# Patient Record
Sex: Male | Born: 1960 | Race: White | Hispanic: No | Marital: Married | Smoking: Former smoker
Health system: Southern US, Community
[De-identification: ages and names within clinical notes are randomized; demographics above are authoritative.]

## PROBLEM LIST (undated history)

## (undated) DIAGNOSIS — J449 Chronic obstructive pulmonary disease, unspecified: Secondary | ICD-10-CM

## (undated) DIAGNOSIS — I10 Essential (primary) hypertension: Secondary | ICD-10-CM

## (undated) DIAGNOSIS — F32A Depression, unspecified: Secondary | ICD-10-CM

## (undated) DIAGNOSIS — Z85038 Personal history of other malignant neoplasm of large intestine: Secondary | ICD-10-CM

## (undated) DIAGNOSIS — F419 Anxiety disorder, unspecified: Secondary | ICD-10-CM

## (undated) DIAGNOSIS — C801 Malignant (primary) neoplasm, unspecified: Secondary | ICD-10-CM

## (undated) DIAGNOSIS — F329 Major depressive disorder, single episode, unspecified: Secondary | ICD-10-CM

## (undated) DIAGNOSIS — E785 Hyperlipidemia, unspecified: Secondary | ICD-10-CM

## (undated) DIAGNOSIS — K219 Gastro-esophageal reflux disease without esophagitis: Secondary | ICD-10-CM

## (undated) HISTORY — DX: Chronic obstructive pulmonary disease, unspecified: J44.9

## (undated) HISTORY — PX: ESOPHAGOGASTRODUODENOSCOPY: SHX1529

## (undated) HISTORY — DX: Personal history of other malignant neoplasm of large intestine: Z85.038

## (undated) HISTORY — DX: Malignant (primary) neoplasm, unspecified: C80.1

## (undated) HISTORY — DX: Essential (primary) hypertension: I10

## (undated) HISTORY — PX: ENDOSCOPIC INSERTION PERITONEAL CATHETER PORT: SUR440

## (undated) HISTORY — DX: Gastro-esophageal reflux disease without esophagitis: K21.9

## (undated) HISTORY — DX: Hyperlipidemia, unspecified: E78.5

---

## 2010-08-13 HISTORY — PX: COLON SURGERY: SHX602

## 2010-08-17 ENCOUNTER — Inpatient Hospital Stay (HOSPITAL_COMMUNITY): Admission: EM | Admit: 2010-08-17 | Discharge: 2010-08-25 | Payer: Self-pay

## 2010-08-19 ENCOUNTER — Encounter (INDEPENDENT_AMBULATORY_CARE_PROVIDER_SITE_OTHER): Payer: Self-pay

## 2010-08-20 ENCOUNTER — Encounter (INDEPENDENT_AMBULATORY_CARE_PROVIDER_SITE_OTHER): Payer: Self-pay

## 2010-08-20 DIAGNOSIS — Z85038 Personal history of other malignant neoplasm of large intestine: Secondary | ICD-10-CM

## 2010-08-20 HISTORY — DX: Personal history of other malignant neoplasm of large intestine: Z85.038

## 2010-08-25 ENCOUNTER — Ambulatory Visit: Payer: Self-pay | Admitting: Oncology

## 2010-09-08 ENCOUNTER — Ambulatory Visit (HOSPITAL_COMMUNITY)
Admission: RE | Admit: 2010-09-08 | Discharge: 2010-09-08 | Payer: Self-pay | Source: Home / Self Care | Attending: Surgery | Admitting: Surgery

## 2010-09-10 LAB — COMPREHENSIVE METABOLIC PANEL
ALT: 14 U/L (ref 0–53)
AST: 15 U/L (ref 0–37)
Alkaline Phosphatase: 54 U/L (ref 39–117)
BUN: 10 mg/dL (ref 6–23)
CO2: 25 mEq/L (ref 19–32)
Calcium: 8.8 mg/dL (ref 8.4–10.5)
Chloride: 102 mEq/L (ref 96–112)
Glucose, Bld: 132 mg/dL — ABNORMAL HIGH (ref 70–99)

## 2010-09-10 LAB — CBC WITH DIFFERENTIAL/PLATELET
Eosinophils Absolute: 0.2 10*3/uL (ref 0.0–0.5)
HCT: 29.5 % — ABNORMAL LOW (ref 38.4–49.9)
HGB: 9.8 g/dL — ABNORMAL LOW (ref 13.0–17.1)
MCV: 83.1 fL (ref 79.3–98.0)
RBC: 3.55 10*6/uL — ABNORMAL LOW (ref 4.20–5.82)
RDW: 13.9 % (ref 11.0–14.6)
WBC: 4 10*3/uL (ref 4.0–10.3)

## 2010-09-17 ENCOUNTER — Ambulatory Visit (HOSPITAL_COMMUNITY)
Admission: RE | Admit: 2010-09-17 | Discharge: 2010-09-17 | Payer: Self-pay | Source: Home / Self Care | Attending: Oncology | Admitting: Oncology

## 2010-09-24 ENCOUNTER — Ambulatory Visit (HOSPITAL_BASED_OUTPATIENT_CLINIC_OR_DEPARTMENT_OTHER): Payer: Self-pay | Admitting: Oncology

## 2010-10-15 ENCOUNTER — Encounter (HOSPITAL_BASED_OUTPATIENT_CLINIC_OR_DEPARTMENT_OTHER): Payer: Self-pay | Admitting: Oncology

## 2010-10-15 ENCOUNTER — Observation Stay (HOSPITAL_COMMUNITY)
Admission: AD | Admit: 2010-10-15 | Discharge: 2010-10-16 | Disposition: A | Discharge status: Home / Self Care | Payer: Self-pay | Source: Ambulatory Visit | Attending: Oncology | Admitting: Oncology

## 2010-10-15 DIAGNOSIS — J449 Chronic obstructive pulmonary disease, unspecified: Secondary | ICD-10-CM

## 2010-10-15 DIAGNOSIS — R29898 Other symptoms and signs involving the musculoskeletal system: Secondary | ICD-10-CM | POA: Insufficient documentation

## 2010-10-15 DIAGNOSIS — C183 Malignant neoplasm of hepatic flexure: Secondary | ICD-10-CM

## 2010-10-15 DIAGNOSIS — Z5111 Encounter for antineoplastic chemotherapy: Secondary | ICD-10-CM

## 2010-10-15 DIAGNOSIS — C189 Malignant neoplasm of colon, unspecified: Secondary | ICD-10-CM

## 2010-10-15 DIAGNOSIS — Z79899 Other long term (current) drug therapy: Secondary | ICD-10-CM | POA: Insufficient documentation

## 2010-10-15 DIAGNOSIS — H538 Other visual disturbances: Secondary | ICD-10-CM | POA: Insufficient documentation

## 2010-10-15 DIAGNOSIS — J4489 Other specified chronic obstructive pulmonary disease: Secondary | ICD-10-CM | POA: Insufficient documentation

## 2010-10-15 DIAGNOSIS — R259 Unspecified abnormal involuntary movements: Secondary | ICD-10-CM | POA: Insufficient documentation

## 2010-10-15 DIAGNOSIS — T451X5A Adverse effect of antineoplastic and immunosuppressive drugs, initial encounter: Secondary | ICD-10-CM | POA: Insufficient documentation

## 2010-10-15 DIAGNOSIS — D649 Anemia, unspecified: Secondary | ICD-10-CM

## 2010-10-15 DIAGNOSIS — R109 Unspecified abdominal pain: Secondary | ICD-10-CM | POA: Insufficient documentation

## 2010-10-15 LAB — CBC WITH DIFFERENTIAL/PLATELET
BASO%: 0.7 % (ref 0.0–2.0)
Basophils Absolute: 0 10*3/uL (ref 0.0–0.1)
EOS%: 3.6 % (ref 0.0–7.0)
Eosinophils Absolute: 0.2 10*3/uL (ref 0.0–0.5)
HCT: 32 % — ABNORMAL LOW (ref 38.4–49.9)
HGB: 10.5 g/dL — ABNORMAL LOW (ref 13.0–17.1)
MONO%: 14.5 % — ABNORMAL HIGH (ref 0.0–14.0)
NEUT#: 1.9 10*3/uL (ref 1.5–6.5)
WBC: 4.2 10*3/uL (ref 4.0–10.3)
lymph#: 1.5 10*3/uL (ref 0.9–3.3)

## 2010-10-15 LAB — COMPREHENSIVE METABOLIC PANEL
Calcium: 9.4 mg/dL (ref 8.4–10.5)
Chloride: 101 mEq/L (ref 96–112)
Glucose, Bld: 105 mg/dL — ABNORMAL HIGH (ref 70–99)
Potassium: 4.2 mEq/L (ref 3.5–5.3)
Total Bilirubin: 0.6 mg/dL (ref 0.3–1.2)

## 2010-11-05 ENCOUNTER — Encounter (HOSPITAL_BASED_OUTPATIENT_CLINIC_OR_DEPARTMENT_OTHER): Payer: Self-pay | Admitting: Oncology

## 2010-11-05 ENCOUNTER — Other Ambulatory Visit: Payer: Self-pay | Admitting: Oncology

## 2010-11-05 DIAGNOSIS — Z5111 Encounter for antineoplastic chemotherapy: Secondary | ICD-10-CM

## 2010-11-05 DIAGNOSIS — C183 Malignant neoplasm of hepatic flexure: Secondary | ICD-10-CM

## 2010-11-05 DIAGNOSIS — D649 Anemia, unspecified: Secondary | ICD-10-CM

## 2010-11-05 LAB — COMPREHENSIVE METABOLIC PANEL
AST: 31 U/L (ref 0–37)
Alkaline Phosphatase: 74 U/L (ref 39–117)
BUN: 8 mg/dL (ref 6–23)
CO2: 24 mEq/L (ref 19–32)
Chloride: 101 mEq/L (ref 96–112)
Creatinine, Ser: 1.19 mg/dL (ref 0.40–1.50)
Glucose, Bld: 102 mg/dL — ABNORMAL HIGH (ref 70–99)
Potassium: 3.4 mEq/L — ABNORMAL LOW (ref 3.5–5.3)

## 2010-11-05 LAB — CBC WITH DIFFERENTIAL/PLATELET
BASO%: 0.7 % (ref 0.0–2.0)
HCT: 30.1 % — ABNORMAL LOW (ref 38.4–49.9)
MCV: 81.6 fL (ref 79.3–98.0)
NEUT#: 2.6 10*3/uL (ref 1.5–6.5)
Platelets: 254 10*3/uL (ref 140–400)
RBC: 3.69 10*6/uL — ABNORMAL LOW (ref 4.20–5.82)
lymph#: 2.1 10*3/uL (ref 0.9–3.3)

## 2010-11-23 LAB — CBC
MCH: 27.3 pg (ref 26.0–34.0)
Platelets: 293 10*3/uL (ref 150–400)
RBC: 3.92 MIL/uL — ABNORMAL LOW (ref 4.22–5.81)
RDW: 13.3 % (ref 11.5–15.5)
WBC: 4.6 10*3/uL (ref 4.0–10.5)

## 2010-11-23 LAB — BASIC METABOLIC PANEL
BUN: 12 mg/dL (ref 6–23)
Calcium: 9.2 mg/dL (ref 8.4–10.5)
Creatinine, Ser: 1.1 mg/dL (ref 0.4–1.5)

## 2010-11-24 LAB — HEMOCCULT GUIAC POC 1CARD (OFFICE): Fecal Occult Bld: POSITIVE

## 2010-11-24 LAB — CBC
HCT: 28.4 % — ABNORMAL LOW (ref 39.0–52.0)
HCT: 31.2 % — ABNORMAL LOW (ref 39.0–52.0)
HCT: 31.3 % — ABNORMAL LOW (ref 39.0–52.0)
HCT: 33.1 % — ABNORMAL LOW (ref 39.0–52.0)
HCT: 33.9 % — ABNORMAL LOW (ref 39.0–52.0)
Hemoglobin: 10.7 g/dL — ABNORMAL LOW (ref 13.0–17.0)
Hemoglobin: 10.7 g/dL — ABNORMAL LOW (ref 13.0–17.0)
MCH: 27.3 pg (ref 26.0–34.0)
MCH: 27.3 pg (ref 26.0–34.0)
MCH: 27.3 pg (ref 26.0–34.0)
MCH: 27.5 pg (ref 26.0–34.0)
MCH: 27.6 pg (ref 26.0–34.0)
MCHC: 31.3 g/dL (ref 30.0–36.0)
MCHC: 31.4 g/dL (ref 30.0–36.0)
MCHC: 31.6 g/dL (ref 30.0–36.0)
MCHC: 32.3 g/dL (ref 30.0–36.0)
MCV: 84.3 fL (ref 78.0–100.0)
Platelets: 178 10*3/uL (ref 150–400)
Platelets: 184 10*3/uL (ref 150–400)
Platelets: 227 10*3/uL (ref 150–400)
RBC: 3.37 MIL/uL — ABNORMAL LOW (ref 4.22–5.81)
RBC: 3.59 MIL/uL — ABNORMAL LOW (ref 4.22–5.81)
RBC: 3.69 MIL/uL — ABNORMAL LOW (ref 4.22–5.81)
WBC: 4.9 10*3/uL (ref 4.0–10.5)
WBC: 6 10*3/uL (ref 4.0–10.5)

## 2010-11-24 LAB — MRSA PCR SCREENING: MRSA by PCR: NEGATIVE

## 2010-11-24 LAB — BASIC METABOLIC PANEL
CO2: 26 mEq/L (ref 19–32)
CO2: 27 mEq/L (ref 19–32)
CO2: 31 mEq/L (ref 19–32)
Calcium: 7.7 mg/dL — ABNORMAL LOW (ref 8.4–10.5)
Chloride: 104 mEq/L (ref 96–112)
Chloride: 95 mEq/L — ABNORMAL LOW (ref 96–112)
Creatinine, Ser: 1.09 mg/dL (ref 0.4–1.5)
GFR calc Af Amer: >60 mL/min (ref >60)
GFR calc non Af Amer: >60 mL/min (ref >60)
GFR calc non Af Amer: >60 mL/min (ref >60)
Glucose, Bld: 100 mg/dL — ABNORMAL HIGH (ref 70–99)
Potassium: 3.7 mEq/L (ref 3.5–5.1)
Potassium: 4.4 mEq/L (ref 3.5–5.1)
Sodium: 142 mEq/L (ref 135–145)

## 2010-11-24 LAB — DIFFERENTIAL
Basophils Relative: 0 % (ref 0–1)
Lymphocytes Relative: 13 % (ref 12–46)
Lymphs Abs: 1.3 10*3/uL (ref 0.7–4.0)
Neutro Abs: 7.4 10*3/uL (ref 1.7–7.7)
Neutrophils Relative %: 77 % (ref 43–77)

## 2010-11-24 LAB — COMPREHENSIVE METABOLIC PANEL
Albumin: 2.8 g/dL — ABNORMAL LOW (ref 3.5–5.2)
BUN: 6 mg/dL (ref 6–23)
Calcium: 7.9 mg/dL — ABNORMAL LOW (ref 8.4–10.5)
Creatinine, Ser: 1.02 mg/dL (ref 0.4–1.5)
Total Protein: 5.5 g/dL — ABNORMAL LOW (ref 6.0–8.3)

## 2010-11-24 LAB — TYPE AND SCREEN: Antibody Screen: NEGATIVE

## 2010-11-26 ENCOUNTER — Other Ambulatory Visit: Payer: Self-pay | Admitting: Oncology

## 2010-11-26 ENCOUNTER — Encounter (HOSPITAL_BASED_OUTPATIENT_CLINIC_OR_DEPARTMENT_OTHER): Payer: Self-pay | Admitting: Oncology

## 2010-11-26 DIAGNOSIS — D649 Anemia, unspecified: Secondary | ICD-10-CM

## 2010-11-26 DIAGNOSIS — C183 Malignant neoplasm of hepatic flexure: Secondary | ICD-10-CM

## 2010-11-26 DIAGNOSIS — J449 Chronic obstructive pulmonary disease, unspecified: Secondary | ICD-10-CM

## 2010-11-26 DIAGNOSIS — Z5111 Encounter for antineoplastic chemotherapy: Secondary | ICD-10-CM

## 2010-11-26 LAB — COMPREHENSIVE METABOLIC PANEL
AST: 25 U/L (ref 0–37)
Alkaline Phosphatase: 70 U/L (ref 39–117)
BUN: 11 mg/dL (ref 6–23)
Creatinine, Ser: 1.1 mg/dL (ref 0.40–1.50)
Glucose, Bld: 123 mg/dL — ABNORMAL HIGH (ref 70–99)
Total Bilirubin: 0.5 mg/dL (ref 0.3–1.2)

## 2010-11-26 LAB — CBC WITH DIFFERENTIAL/PLATELET
BASO%: 0.2 % (ref 0.0–2.0)
Basophils Absolute: 0 10*3/uL (ref 0.0–0.1)
EOS%: 2.2 % (ref 0.0–7.0)
HCT: 29.7 % — ABNORMAL LOW (ref 38.4–49.9)
LYMPH%: 34.3 % (ref 14.0–49.0)
MCH: 28.4 pg (ref 27.2–33.4)
MCHC: 32.7 g/dL (ref 32.0–36.0)
MCV: 87.1 fL (ref 79.3–98.0)
MONO%: 12.4 % (ref 0.0–14.0)
NEUT%: 50.9 % (ref 39.0–75.0)
Platelets: 216 10*3/uL (ref 140–400)
lymph#: 1.6 10*3/uL (ref 0.9–3.3)

## 2010-12-17 ENCOUNTER — Encounter (HOSPITAL_BASED_OUTPATIENT_CLINIC_OR_DEPARTMENT_OTHER): Payer: Self-pay | Admitting: Oncology

## 2010-12-17 ENCOUNTER — Other Ambulatory Visit: Payer: Self-pay | Admitting: Oncology

## 2010-12-17 DIAGNOSIS — Z5111 Encounter for antineoplastic chemotherapy: Secondary | ICD-10-CM

## 2010-12-17 DIAGNOSIS — C183 Malignant neoplasm of hepatic flexure: Secondary | ICD-10-CM

## 2010-12-17 DIAGNOSIS — D649 Anemia, unspecified: Secondary | ICD-10-CM

## 2010-12-17 DIAGNOSIS — R29898 Other symptoms and signs involving the musculoskeletal system: Secondary | ICD-10-CM

## 2010-12-17 LAB — COMPREHENSIVE METABOLIC PANEL
Albumin: 4 g/dL (ref 3.5–5.2)
Alkaline Phosphatase: 55 U/L (ref 39–117)
BUN: 7 mg/dL (ref 6–23)
CO2: 23 mEq/L (ref 19–32)
Glucose, Bld: 102 mg/dL — ABNORMAL HIGH (ref 70–99)
Potassium: 4.3 mEq/L (ref 3.5–5.3)
Sodium: 137 mEq/L (ref 135–145)
Total Protein: 6.7 g/dL (ref 6.0–8.3)

## 2010-12-17 LAB — CBC WITH DIFFERENTIAL/PLATELET
Basophils Absolute: 0 10*3/uL (ref 0.0–0.1)
EOS%: 1.8 % (ref 0.0–7.0)
Eosinophils Absolute: 0.1 10*3/uL (ref 0.0–0.5)
HCT: 32.9 % — ABNORMAL LOW (ref 38.4–49.9)
HGB: 10.8 g/dL — ABNORMAL LOW (ref 13.0–17.1)
MCH: 31.3 pg (ref 27.2–33.4)
MCV: 95.4 fL (ref 79.3–98.0)
MONO%: 12 % (ref 0.0–14.0)
NEUT#: 2.2 10*3/uL (ref 1.5–6.5)
NEUT%: 50.7 % (ref 39.0–75.0)
RDW: 26.5 % — ABNORMAL HIGH (ref 11.0–14.6)
lymph#: 1.5 10*3/uL (ref 0.9–3.3)

## 2010-12-18 ENCOUNTER — Encounter: Payer: Self-pay | Admitting: Genetic Counselor

## 2011-01-07 ENCOUNTER — Other Ambulatory Visit: Payer: Self-pay | Admitting: Oncology

## 2011-01-07 ENCOUNTER — Encounter (HOSPITAL_BASED_OUTPATIENT_CLINIC_OR_DEPARTMENT_OTHER): Payer: Self-pay | Admitting: Oncology

## 2011-01-07 DIAGNOSIS — C183 Malignant neoplasm of hepatic flexure: Secondary | ICD-10-CM

## 2011-01-07 DIAGNOSIS — J449 Chronic obstructive pulmonary disease, unspecified: Secondary | ICD-10-CM

## 2011-01-07 DIAGNOSIS — D649 Anemia, unspecified: Secondary | ICD-10-CM

## 2011-01-07 DIAGNOSIS — J4489 Other specified chronic obstructive pulmonary disease: Secondary | ICD-10-CM

## 2011-01-07 DIAGNOSIS — Z5111 Encounter for antineoplastic chemotherapy: Secondary | ICD-10-CM

## 2011-01-07 LAB — CBC WITH DIFFERENTIAL/PLATELET
Basophils Absolute: 0 10*3/uL (ref 0.0–0.1)
Eosinophils Absolute: 0.2 10*3/uL (ref 0.0–0.5)
HGB: 11.4 g/dL — ABNORMAL LOW (ref 13.0–17.1)
MCV: 97.7 fL (ref 79.3–98.0)
MONO#: 0.6 10*3/uL (ref 0.1–0.9)
NEUT#: 2.3 10*3/uL (ref 1.5–6.5)
RBC: 3.45 10*6/uL — ABNORMAL LOW (ref 4.20–5.82)
RDW: 21.5 % — ABNORMAL HIGH (ref 11.0–14.6)
WBC: 4.2 10*3/uL (ref 4.0–10.3)
lymph#: 1.1 10*3/uL (ref 0.9–3.3)

## 2011-01-07 LAB — COMPREHENSIVE METABOLIC PANEL
Albumin: 4 g/dL (ref 3.5–5.2)
Alkaline Phosphatase: 60 U/L (ref 39–117)
BUN: 10 mg/dL (ref 6–23)
CO2: 22 mEq/L (ref 19–32)
Calcium: 9.1 mg/dL (ref 8.4–10.5)
Chloride: 104 mEq/L (ref 96–112)
Glucose, Bld: 98 mg/dL (ref 70–99)
Potassium: 4.2 mEq/L (ref 3.5–5.3)
Sodium: 137 mEq/L (ref 135–145)
Total Protein: 6.8 g/dL (ref 6.0–8.3)

## 2011-01-21 ENCOUNTER — Ambulatory Visit (INDEPENDENT_AMBULATORY_CARE_PROVIDER_SITE_OTHER): Payer: Self-pay | Admitting: Psychiatry

## 2011-01-21 DIAGNOSIS — F1010 Alcohol abuse, uncomplicated: Secondary | ICD-10-CM

## 2011-01-21 DIAGNOSIS — F0630 Mood disorder due to known physiological condition, unspecified: Secondary | ICD-10-CM

## 2011-01-27 ENCOUNTER — Ambulatory Visit (INDEPENDENT_AMBULATORY_CARE_PROVIDER_SITE_OTHER): Payer: Self-pay | Admitting: Psychiatry

## 2011-01-27 ENCOUNTER — Ambulatory Visit: Payer: Self-pay | Admitting: Psychiatry

## 2011-01-27 DIAGNOSIS — F0630 Mood disorder due to known physiological condition, unspecified: Secondary | ICD-10-CM

## 2011-01-27 DIAGNOSIS — F1010 Alcohol abuse, uncomplicated: Secondary | ICD-10-CM

## 2011-01-28 ENCOUNTER — Encounter (HOSPITAL_BASED_OUTPATIENT_CLINIC_OR_DEPARTMENT_OTHER): Payer: Self-pay | Admitting: Oncology

## 2011-01-28 ENCOUNTER — Other Ambulatory Visit: Payer: Self-pay | Admitting: Oncology

## 2011-01-28 DIAGNOSIS — Z5111 Encounter for antineoplastic chemotherapy: Secondary | ICD-10-CM

## 2011-01-28 DIAGNOSIS — C183 Malignant neoplasm of hepatic flexure: Secondary | ICD-10-CM

## 2011-01-28 DIAGNOSIS — D649 Anemia, unspecified: Secondary | ICD-10-CM

## 2011-01-28 LAB — CBC WITH DIFFERENTIAL/PLATELET
Eosinophils Absolute: 0.1 10*3/uL (ref 0.0–0.5)
HGB: 11.3 g/dL — ABNORMAL LOW (ref 13.0–17.1)
MCV: 99.7 fL — ABNORMAL HIGH (ref 79.3–98.0)
MONO%: 14 % (ref 0.0–14.0)
NEUT#: 2 10*3/uL (ref 1.5–6.5)
RBC: 3.34 10*6/uL — ABNORMAL LOW (ref 4.20–5.82)
RDW: 19.8 % — ABNORMAL HIGH (ref 11.0–14.6)
WBC: 4.1 10*3/uL (ref 4.0–10.3)
lymph#: 1.5 10*3/uL (ref 0.9–3.3)
nRBC: 0 % (ref 0–0)

## 2011-01-28 LAB — COMPREHENSIVE METABOLIC PANEL
ALT: 29 U/L (ref 0–53)
Albumin: 3.9 g/dL (ref 3.5–5.2)
Alkaline Phosphatase: 69 U/L (ref 39–117)
CO2: 23 mEq/L (ref 19–32)
Potassium: 4.3 mEq/L (ref 3.5–5.3)
Sodium: 136 mEq/L (ref 135–145)
Total Bilirubin: 0.3 mg/dL (ref 0.3–1.2)
Total Protein: 7 g/dL (ref 6.0–8.3)

## 2011-02-10 ENCOUNTER — Ambulatory Visit (INDEPENDENT_AMBULATORY_CARE_PROVIDER_SITE_OTHER): Payer: Self-pay | Admitting: Psychiatry

## 2011-02-10 DIAGNOSIS — F1010 Alcohol abuse, uncomplicated: Secondary | ICD-10-CM

## 2011-02-10 DIAGNOSIS — F0630 Mood disorder due to known physiological condition, unspecified: Secondary | ICD-10-CM

## 2011-02-17 ENCOUNTER — Ambulatory Visit: Payer: Self-pay | Admitting: Psychiatry

## 2011-02-18 ENCOUNTER — Encounter (HOSPITAL_BASED_OUTPATIENT_CLINIC_OR_DEPARTMENT_OTHER): Payer: Self-pay | Admitting: Oncology

## 2011-02-18 ENCOUNTER — Other Ambulatory Visit: Payer: Self-pay | Admitting: Oncology

## 2011-02-18 DIAGNOSIS — Z5111 Encounter for antineoplastic chemotherapy: Secondary | ICD-10-CM

## 2011-02-18 DIAGNOSIS — C183 Malignant neoplasm of hepatic flexure: Secondary | ICD-10-CM

## 2011-02-18 DIAGNOSIS — R29898 Other symptoms and signs involving the musculoskeletal system: Secondary | ICD-10-CM

## 2011-02-18 DIAGNOSIS — D649 Anemia, unspecified: Secondary | ICD-10-CM

## 2011-02-18 LAB — CBC WITH DIFFERENTIAL/PLATELET
BASO%: 0.5 % (ref 0.0–2.0)
EOS%: 1.7 % (ref 0.0–7.0)
LYMPH%: 32.5 % (ref 14.0–49.0)
MCHC: 33.9 g/dL (ref 32.0–36.0)
MCV: 99.7 fL — ABNORMAL HIGH (ref 79.3–98.0)
MONO%: 12.8 % (ref 0.0–14.0)
Platelets: 176 10*3/uL (ref 140–400)
RBC: 3.37 10*6/uL — ABNORMAL LOW (ref 4.20–5.82)
nRBC: 0 % (ref 0–0)

## 2011-02-18 LAB — COMPREHENSIVE METABOLIC PANEL
ALT: 31 U/L (ref 0–53)
AST: 36 U/L (ref 0–37)
Alkaline Phosphatase: 70 U/L (ref 39–117)
Creatinine, Ser: 0.96 mg/dL (ref 0.50–1.35)
Total Bilirubin: 0.3 mg/dL (ref 0.3–1.2)

## 2011-02-24 ENCOUNTER — Ambulatory Visit (INDEPENDENT_AMBULATORY_CARE_PROVIDER_SITE_OTHER): Payer: Self-pay | Admitting: Psychiatry

## 2011-02-24 DIAGNOSIS — F1010 Alcohol abuse, uncomplicated: Secondary | ICD-10-CM

## 2011-02-24 DIAGNOSIS — F0630 Mood disorder due to known physiological condition, unspecified: Secondary | ICD-10-CM

## 2011-03-18 ENCOUNTER — Other Ambulatory Visit: Payer: Self-pay | Admitting: Oncology

## 2011-03-18 ENCOUNTER — Encounter (HOSPITAL_BASED_OUTPATIENT_CLINIC_OR_DEPARTMENT_OTHER): Payer: Self-pay | Admitting: Oncology

## 2011-03-18 ENCOUNTER — Ambulatory Visit (INDEPENDENT_AMBULATORY_CARE_PROVIDER_SITE_OTHER): Payer: Self-pay | Admitting: Psychiatry

## 2011-03-18 DIAGNOSIS — R29898 Other symptoms and signs involving the musculoskeletal system: Secondary | ICD-10-CM

## 2011-03-18 DIAGNOSIS — D649 Anemia, unspecified: Secondary | ICD-10-CM

## 2011-03-18 DIAGNOSIS — Z5111 Encounter for antineoplastic chemotherapy: Secondary | ICD-10-CM

## 2011-03-18 DIAGNOSIS — F0630 Mood disorder due to known physiological condition, unspecified: Secondary | ICD-10-CM

## 2011-03-18 DIAGNOSIS — C183 Malignant neoplasm of hepatic flexure: Secondary | ICD-10-CM

## 2011-03-18 DIAGNOSIS — F1010 Alcohol abuse, uncomplicated: Secondary | ICD-10-CM

## 2011-03-18 LAB — CBC WITH DIFFERENTIAL/PLATELET
BASO%: 0.3 % (ref 0.0–2.0)
HCT: 34.1 % — ABNORMAL LOW (ref 38.4–49.9)
LYMPH%: 33.7 % (ref 14.0–49.0)
MCH: 34 pg — ABNORMAL HIGH (ref 27.2–33.4)
MCHC: 34 g/dL (ref 32.0–36.0)
MONO#: 0.6 10*3/uL (ref 0.1–0.9)
NEUT%: 44.9 % (ref 39.0–75.0)
Platelets: 160 10*3/uL (ref 140–400)
WBC: 3.3 10*3/uL — ABNORMAL LOW (ref 4.0–10.3)

## 2011-04-01 ENCOUNTER — Encounter (HOSPITAL_BASED_OUTPATIENT_CLINIC_OR_DEPARTMENT_OTHER): Payer: Self-pay | Admitting: Oncology

## 2011-04-01 ENCOUNTER — Other Ambulatory Visit: Payer: Self-pay | Admitting: Oncology

## 2011-04-01 DIAGNOSIS — Z452 Encounter for adjustment and management of vascular access device: Secondary | ICD-10-CM

## 2011-04-01 DIAGNOSIS — C183 Malignant neoplasm of hepatic flexure: Secondary | ICD-10-CM

## 2011-04-21 ENCOUNTER — Ambulatory Visit: Payer: Self-pay | Admitting: Psychiatry

## 2011-05-13 ENCOUNTER — Other Ambulatory Visit: Payer: Self-pay | Admitting: Oncology

## 2011-05-13 ENCOUNTER — Encounter (HOSPITAL_BASED_OUTPATIENT_CLINIC_OR_DEPARTMENT_OTHER): Payer: Self-pay | Admitting: Oncology

## 2011-05-13 DIAGNOSIS — C189 Malignant neoplasm of colon, unspecified: Secondary | ICD-10-CM

## 2011-05-13 DIAGNOSIS — C183 Malignant neoplasm of hepatic flexure: Secondary | ICD-10-CM

## 2011-05-14 ENCOUNTER — Other Ambulatory Visit: Payer: Self-pay | Admitting: Oncology

## 2011-05-14 DIAGNOSIS — C189 Malignant neoplasm of colon, unspecified: Secondary | ICD-10-CM

## 2011-06-25 ENCOUNTER — Encounter (INDEPENDENT_AMBULATORY_CARE_PROVIDER_SITE_OTHER): Payer: Self-pay | Admitting: Surgery

## 2011-06-25 ENCOUNTER — Ambulatory Visit (INDEPENDENT_AMBULATORY_CARE_PROVIDER_SITE_OTHER): Payer: Self-pay | Admitting: Surgery

## 2011-06-25 VITALS — BP 127/82 | HR 64 | Temp 97.3°F | Resp 14 | Ht 67.0 in | Wt 218.4 lb

## 2011-06-25 DIAGNOSIS — Z452 Encounter for adjustment and management of vascular access device: Secondary | ICD-10-CM

## 2011-06-25 NOTE — Progress Notes (Signed)
Mr. Sauseda comes in today to discuss his Port-A-Cath removal. 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 Otherwise his need for his port is gone and he wishes it to be removed.  We'll schedule Port-A-Cath removal under MAC.

## 2011-07-05 ENCOUNTER — Ambulatory Visit (HOSPITAL_BASED_OUTPATIENT_CLINIC_OR_DEPARTMENT_OTHER)
Admission: RE | Admit: 2011-07-05 | Discharge: 2011-07-05 | Disposition: A | Discharge status: Home / Self Care | Payer: Self-pay | Source: Ambulatory Visit | Attending: Surgery | Admitting: Surgery

## 2011-07-05 DIAGNOSIS — I10 Essential (primary) hypertension: Secondary | ICD-10-CM | POA: Insufficient documentation

## 2011-07-05 DIAGNOSIS — G609 Hereditary and idiopathic neuropathy, unspecified: Secondary | ICD-10-CM | POA: Insufficient documentation

## 2011-07-05 DIAGNOSIS — J4489 Other specified chronic obstructive pulmonary disease: Secondary | ICD-10-CM | POA: Insufficient documentation

## 2011-07-05 DIAGNOSIS — J449 Chronic obstructive pulmonary disease, unspecified: Secondary | ICD-10-CM | POA: Insufficient documentation

## 2011-07-05 DIAGNOSIS — Z452 Encounter for adjustment and management of vascular access device: Secondary | ICD-10-CM | POA: Insufficient documentation

## 2011-07-05 DIAGNOSIS — Z85038 Personal history of other malignant neoplasm of large intestine: Secondary | ICD-10-CM | POA: Insufficient documentation

## 2011-07-05 LAB — POCT I-STAT, CHEM 8
Creatinine, Ser: 1.1 mg/dL (ref 0.50–1.35)
Glucose, Bld: 132 mg/dL — ABNORMAL HIGH (ref 70–99)
Hemoglobin: 14.3 g/dL (ref 13.0–17.0)
Potassium: 4.4 mEq/L (ref 3.5–5.1)

## 2011-07-07 NOTE — Op Note (Signed)
  [name], [name]            ACCOUNT NO.:  [id number]  MEDICAL RECORD NO.:  [id number]  LOCATION:                                 FACILITY:  PHYSICIAN:  Thornton Park. Daphine Deutscher, MD       DATE OF BIRTH:  DATE OF PROCEDURE:  07/05/2011 DATE OF DISCHARGE:                              OPERATIVE REPORT   PREOPERATIVE DIAGNOSIS:  Port-A-Cath for chemotherapy.  POSTOPERATIVE DIAGNOSIS:  Port-A-Cath for chemotherapy.  PROCEDURE:  Explantation of left subclavian Port-A-Cath.  SURGEON:  Thornton Park. Daphine Deutscher, MD  ASSISTANT:  None.  ANESTHESIA:  MAC.  DESCRIPTION OF PROCEDURE:  [name] was taken to room 80 at [facility] on Monday, July 05, 2011, given MAC anesthesia.  His left chest was prepped with PCMX and draped sterilely.  I infiltrated the area with lidocaine, excised his old scar, went down deep and grabbing the Port-A-Cath cutting the suture that was holding in place and then explanting it in toto.  The wound was then closed with 4-0 Vicryl in the depths and with interrupted 5-0 Vicryl with Dermabond. The patient tolerated the procedure well.  He will be taken to recovery room in satisfactory condition.  He will be given some Vicodin if needed for pain and be followed up in the office.     Thornton Park Daphine Deutscher, MD     MBM/MEDQ  D:  07/05/2011  T:  07/05/2011  Job:  [id number]  Electronically Signed by Luretha Murphy MD on 07/07/2011 05:01:07 PM

## 2011-07-29 ENCOUNTER — Other Ambulatory Visit: Payer: Self-pay | Admitting: *Deleted

## 2011-07-29 ENCOUNTER — Encounter: Payer: Self-pay | Admitting: *Deleted

## 2011-07-29 MED ORDER — OXYCODONE-ACETAMINOPHEN 5-325 MG PO TABS: 1.0000 | ORAL_TABLET | Freq: Four times a day (QID) | ORAL | Status: AC | PRN

## 2011-08-06 ENCOUNTER — Other Ambulatory Visit: Payer: Self-pay | Admitting: *Deleted

## 2011-08-06 DIAGNOSIS — C183 Malignant neoplasm of hepatic flexure: Secondary | ICD-10-CM

## 2011-08-06 MED ORDER — GABAPENTIN 300 MG PO CAPS: 300.0000 mg | ORAL_CAPSULE | Freq: Three times a day (TID) | ORAL | Status: DC

## 2011-08-17 ENCOUNTER — Other Ambulatory Visit: Payer: Self-pay | Admitting: Gastroenterology

## 2011-08-17 ENCOUNTER — Ambulatory Visit (HOSPITAL_COMMUNITY)
Admission: RE | Admit: 2011-08-17 | Discharge: 2011-08-17 | Disposition: A | Discharge status: Home / Self Care | Payer: Self-pay | Source: Ambulatory Visit | Attending: Gastroenterology | Admitting: Gastroenterology

## 2011-08-17 ENCOUNTER — Encounter (HOSPITAL_COMMUNITY): Payer: Self-pay | Admitting: *Deleted

## 2011-08-17 ENCOUNTER — Encounter (HOSPITAL_COMMUNITY)
Admission: RE | Disposition: A | Discharge status: Home / Self Care | Payer: Self-pay | Source: Ambulatory Visit | Attending: Gastroenterology

## 2011-08-17 DIAGNOSIS — D126 Benign neoplasm of colon, unspecified: Secondary | ICD-10-CM | POA: Insufficient documentation

## 2011-08-17 DIAGNOSIS — Z09 Encounter for follow-up examination after completed treatment for conditions other than malignant neoplasm: Secondary | ICD-10-CM | POA: Insufficient documentation

## 2011-08-17 DIAGNOSIS — K648 Other hemorrhoids: Secondary | ICD-10-CM | POA: Insufficient documentation

## 2011-08-17 DIAGNOSIS — K644 Residual hemorrhoidal skin tags: Secondary | ICD-10-CM | POA: Insufficient documentation

## 2011-08-17 DIAGNOSIS — Z85038 Personal history of other malignant neoplasm of large intestine: Secondary | ICD-10-CM | POA: Insufficient documentation

## 2011-08-17 DIAGNOSIS — K5730 Diverticulosis of large intestine without perforation or abscess without bleeding: Secondary | ICD-10-CM | POA: Insufficient documentation

## 2011-08-17 HISTORY — DX: Major depressive disorder, single episode, unspecified: F32.9

## 2011-08-17 HISTORY — DX: Anxiety disorder, unspecified: F41.9

## 2011-08-17 HISTORY — DX: Depression, unspecified: F32.A

## 2011-08-17 HISTORY — PX: COLONOSCOPY: SHX5424

## 2011-08-17 SURGERY — COLONOSCOPY
Anesthesia: Moderate Sedation

## 2011-08-17 MED ORDER — FENTANYL CITRATE 0.05 MG/ML IJ SOLN
INTRAMUSCULAR | Status: DC | PRN
  Administered 2011-08-17 (×5): 25 ug via INTRAVENOUS

## 2011-08-17 MED ORDER — MIDAZOLAM HCL 10 MG/2ML IJ SOLN
INTRAMUSCULAR | Status: DC | PRN
  Administered 2011-08-17 (×6): 2 mg via INTRAVENOUS

## 2011-08-17 MED ORDER — SODIUM CHLORIDE 0.9 % IV SOLN
Freq: Once | INTRAVENOUS | Status: AC
  Administered 2011-08-17: 500 mL via INTRAVENOUS

## 2011-08-17 MED ORDER — MIDAZOLAM HCL 10 MG/2ML IJ SOLN
INTRAMUSCULAR | Status: AC
  Filled 2011-08-17: qty 4

## 2011-08-17 MED ORDER — DIPHENHYDRAMINE HCL 50 MG/ML IJ SOLN
INTRAMUSCULAR | Status: AC
  Filled 2011-08-17: qty 1

## 2011-08-17 MED ORDER — FENTANYL CITRATE 0.05 MG/ML IJ SOLN
INTRAMUSCULAR | Status: AC
  Filled 2011-08-17: qty 4

## 2011-08-17 MED ORDER — DIPHENHYDRAMINE HCL 50 MG/ML IJ SOLN
INTRAMUSCULAR | Status: DC | PRN
  Administered 2011-08-17: 25 mg via INTRAVENOUS

## 2011-08-17 NOTE — H&P (Signed)
  Patient seen and examined in preop please see scanned report for details no new complaints or problems

## 2011-08-18 ENCOUNTER — Encounter (HOSPITAL_COMMUNITY): Payer: Self-pay

## 2011-08-18 ENCOUNTER — Encounter (HOSPITAL_COMMUNITY): Payer: Self-pay | Admitting: Gastroenterology

## 2011-08-20 ENCOUNTER — Encounter (INDEPENDENT_AMBULATORY_CARE_PROVIDER_SITE_OTHER): Payer: Self-pay | Admitting: Surgery

## 2011-08-25 ENCOUNTER — Other Ambulatory Visit: Payer: Self-pay | Admitting: Oncology

## 2011-08-25 ENCOUNTER — Other Ambulatory Visit (HOSPITAL_COMMUNITY): Payer: Self-pay

## 2011-08-25 ENCOUNTER — Ambulatory Visit (HOSPITAL_COMMUNITY)
Admission: RE | Admit: 2011-08-25 | Discharge: 2011-08-25 | Disposition: A | Discharge status: Home / Self Care | Payer: Self-pay | Source: Ambulatory Visit | Attending: Oncology | Admitting: Oncology

## 2011-08-25 ENCOUNTER — Other Ambulatory Visit (HOSPITAL_BASED_OUTPATIENT_CLINIC_OR_DEPARTMENT_OTHER): Payer: Self-pay | Admitting: Lab

## 2011-08-25 ENCOUNTER — Other Ambulatory Visit: Payer: Self-pay | Admitting: *Deleted

## 2011-08-25 DIAGNOSIS — Z980 Intestinal bypass and anastomosis status: Secondary | ICD-10-CM | POA: Insufficient documentation

## 2011-08-25 DIAGNOSIS — C183 Malignant neoplasm of hepatic flexure: Secondary | ICD-10-CM

## 2011-08-25 DIAGNOSIS — C189 Malignant neoplasm of colon, unspecified: Secondary | ICD-10-CM | POA: Insufficient documentation

## 2011-08-25 DIAGNOSIS — Z5111 Encounter for antineoplastic chemotherapy: Secondary | ICD-10-CM

## 2011-08-25 DIAGNOSIS — Z452 Encounter for adjustment and management of vascular access device: Secondary | ICD-10-CM

## 2011-08-25 DIAGNOSIS — Z9221 Personal history of antineoplastic chemotherapy: Secondary | ICD-10-CM | POA: Insufficient documentation

## 2011-08-25 DIAGNOSIS — Z9049 Acquired absence of other specified parts of digestive tract: Secondary | ICD-10-CM | POA: Insufficient documentation

## 2011-08-25 LAB — CBC WITH DIFFERENTIAL/PLATELET
BASO%: 0.4 % (ref 0.0–2.0)
HCT: 38.7 % (ref 38.4–49.9)
MCHC: 33.7 g/dL (ref 32.0–36.0)
MONO#: 0.5 10*3/uL (ref 0.1–0.9)
NEUT#: 3.1 10*3/uL (ref 1.5–6.5)
RBC: 4.46 10*6/uL (ref 4.20–5.82)
WBC: 5.3 10*3/uL (ref 4.0–10.3)
lymph#: 1.5 10*3/uL (ref 0.9–3.3)

## 2011-08-25 LAB — CMP (CANCER CENTER ONLY)
ALT(SGPT): 70 U/L — ABNORMAL HIGH (ref 10–47)
Albumin: 3.6 g/dL (ref 3.3–5.5)
CO2: 28 mEq/L (ref 18–33)
Calcium: 9.1 mg/dL (ref 8.0–10.3)
Chloride: 100 mEq/L (ref 98–108)
Sodium: 140 mEq/L (ref 128–145)
Total Protein: 7.5 g/dL (ref 6.4–8.1)

## 2011-08-25 LAB — CEA: CEA: 2.1 ng/mL (ref 0.0–5.0)

## 2011-08-25 MED ORDER — IOHEXOL 300 MG/ML  SOLN
100.0000 mL | Freq: Once | INTRAMUSCULAR | Status: AC | PRN
  Administered 2011-08-25: 100 mL via INTRAVENOUS

## 2011-08-25 MED ORDER — LORAZEPAM 1 MG PO TABS: 1.0000 mg | ORAL_TABLET | Freq: Three times a day (TID) | ORAL | Status: DC

## 2011-08-30 ENCOUNTER — Encounter: Payer: Self-pay | Admitting: *Deleted

## 2011-08-30 ENCOUNTER — Ambulatory Visit (HOSPITAL_BASED_OUTPATIENT_CLINIC_OR_DEPARTMENT_OTHER): Payer: Self-pay | Admitting: Oncology

## 2011-08-30 ENCOUNTER — Ambulatory Visit: Payer: Self-pay

## 2011-08-30 ENCOUNTER — Other Ambulatory Visit: Payer: Self-pay | Admitting: *Deleted

## 2011-08-30 VITALS — BP 143/76 | HR 84 | Temp 98.4°F | Ht 67.0 in | Wt 218.2 lb

## 2011-08-30 DIAGNOSIS — C189 Malignant neoplasm of colon, unspecified: Secondary | ICD-10-CM

## 2011-08-30 LAB — BASIC METABOLIC PANEL
Calcium: 9.6 mg/dL (ref 8.4–10.5)
Creatinine, Ser: 1.14 mg/dL (ref 0.50–1.35)

## 2011-08-30 MED ORDER — OXYCODONE-ACETAMINOPHEN 5-325 MG PO TABS: 1.0000 | ORAL_TABLET | Freq: Two times a day (BID) | ORAL | Status: AC | PRN

## 2011-08-30 NOTE — Progress Notes (Signed)
OFFICE PROGRESS NOTE   INTERVAL HISTORY:   He returns as scheduled. He feels well. He reports some improvement in the neuropathic pain with Neurontin. He continues to have numbness in the feet more so than the fingers. He does not have difficulty tying his shoes or buttoning his shirt. He has some difficulty snapping his pants.  He has developed a "rash" at the right arm, neck, and left groin. This predated the CT scan.   He would like to try returning to work in early January.  The pain surrounding the right abdominal incision persists. He currently takes 2 oxycodone tablets per day for relief of pain the  Objective:  Vital signs in last 24 hours:  Blood pressure 143/76, pulse 84, temperature 98.4 F (36.9 C), height 5\' 7"  (1.702 m), weight 218 lb 3.2 oz (98.975 kg).    HEENT: Neck without mass Lymphatics: No cervical, supraclavicular, axillary, or inguinal lymph node Resp: Lungs clear bilateral Cardio: Regular rate and GI: No hepatomegaly. No mass. There is tenderness with superficial palpation surrounding the right mid abdominal scar. Vascular: No leg edema Neuro: There is a markedly decrease in vibratory sense at the fingers and toes. Decreased sensation to light touch at the toes per  Skin: Multiple tattoos. Mild erythema at the skin folds of the neck, right elbow fold, and left groin. No vesicles.   X-rays: Restaging CTs of the chest, abdomen, and pelvis on 08/25/2011 reveal no evidence of metastatic disease  Lab Results:  Lab Results  Component Value Date   WBC 5.3 08/25/2011   HGB 13.0 08/25/2011   HCT 38.7 08/25/2011   MCV 86.7 08/25/2011   PLT 198 08/25/2011      Medications: I have reviewed the patient's current medications.  Assessment/Plan: 1. Stage III (T3 N1) colon cancer status post right hemicolectomy 08/20/2010.  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  He began treatment with CAPOX on  09/24/2010.  He completed cycle 8 beginning  02/18/2011.        -Restaging CTs of the chest, abdomen, and pelvis on 08/25/2011 reveal no evidence of metastatic disease 2. Staging CT of the chest 09/17/2010 negative for evidence of metastatic disease. 3. Anemia likely related to preoperative gastrointestinal bleeding:  Stable on labs 03/18/2011.   4. History of alcohol use. 5. History of gout with an acute flare at the foot 11/26/2010. 6. Chronic obstructive pulmonary disease. 7. Hypertension. 8. Remote history of a bleeding gastric ulcer. 9. Family history of cancer.  Microsatellite instability testing returned with a high microsatellite genotype.  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 10. Right-sided abdominal pain surrounding the transverse incision, persistent.  Question neuropathic pain related to surgery and oxaliplatin. 11. Acute neurotoxicity related to oxaliplatin manifested with leg weakness, ataxia, blurred vision following cycle 2 on 10/25/2010.  The oxaliplatin was dose reduced by 50% with cycle 3 and diluted in a larger volume with administration over 4 hours.  The acute neurotoxicity did not recur. 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  He has pain associated with the neuropathy.  He has noted improvement in the pain since beginning Neurontin. The numbness at the hands and feet persist. 13.   Surveillance colonoscopy 08/17/2011 by Dr. Ewing Schlein with 2 small benign polyps at the left colon. He should continue yearly colonoscopy with a history of hereditary non-polyposis colon cancer syndrome.  Disposition:  He remains in remission from colon cancer. He will  return for an office visit in 3 months to followup on the neuropathy symptoms. He plans to return to work in early January. I cautioned him against working while on narcotic analgesics. He should avoid activities that require fine motor skills/dexterity due to the persistent neuropathy symptoms at the hands and  feet.  The creatinine and potassium levels were mildly elevated prior to the CT scan. We obtained a repeat chemistry panel today.   Lucile Shutters, MD  08/30/2011  2:15 PM

## 2011-08-31 ENCOUNTER — Telehealth: Payer: Self-pay | Admitting: Oncology

## 2011-08-31 ENCOUNTER — Encounter: Payer: Self-pay | Admitting: *Deleted

## 2011-08-31 NOTE — Progress Notes (Signed)
Patient requested MD write script for him to take to work for employer that he can return to work as of 09/14/11. MD wrote script for following....  May return to work 09/14/2011  Can not work while taking narcotics for pain  Limit activity based on neuropathy symptoms for safety.  **mailed script to his home as requested.

## 2011-08-31 NOTE — Telephone Encounter (Signed)
Faxed disability paper to [employer] [phone]

## 2011-09-01 ENCOUNTER — Telehealth: Payer: Self-pay | Admitting: *Deleted

## 2011-09-01 NOTE — Telephone Encounter (Signed)
Patient notified that K+ and creatinine are better. Forwarded copy to Dr. Pricilla Holm.

## 2011-09-01 NOTE — Telephone Encounter (Signed)
Message copied by Wandalee Ferdinand on Wed Sep 01, 2011 11:52 AM ------      Message from: Thornton Papas B      Created: Mon Aug 30, 2011  8:11 PM       Please call patient, K+ and creatinine are better.  Copy lab to primary MD

## 2011-09-02 ENCOUNTER — Other Ambulatory Visit: Payer: Self-pay | Admitting: *Deleted

## 2011-09-02 ENCOUNTER — Telehealth: Payer: Self-pay | Admitting: Oncology

## 2011-09-02 DIAGNOSIS — C183 Malignant neoplasm of hepatic flexure: Secondary | ICD-10-CM

## 2011-09-02 NOTE — Telephone Encounter (Signed)
lmonvm advising the pt of the his lab and md appt in march

## 2011-10-13 ENCOUNTER — Telehealth (INDEPENDENT_AMBULATORY_CARE_PROVIDER_SITE_OTHER): Payer: Self-pay | Admitting: General Surgery

## 2011-10-13 NOTE — Telephone Encounter (Signed)
GLORIA Zavalza CALLED TO REPORT THAT MR Doubrava IS EXPERIENCING INTERMITTENT PAIN UNDER INCISION RLQ/ THIS STARTED LAST WEEK AFTER HE HAD BACK INJURY/ APPETITIE GOOD/ NO NAUSEA OR VOMITING BUT SOME ABD BLOATING/ BOWELS ARE MOVING/ NO FEVER/ TRACEY MADE APPT WITH DR. MARTIN FOR THIS WEEK/GY

## 2011-10-15 ENCOUNTER — Ambulatory Visit (INDEPENDENT_AMBULATORY_CARE_PROVIDER_SITE_OTHER): Payer: Self-pay | Admitting: Surgery

## 2011-10-15 ENCOUNTER — Encounter (INDEPENDENT_AMBULATORY_CARE_PROVIDER_SITE_OTHER): Payer: Self-pay | Admitting: Surgery

## 2011-10-15 DIAGNOSIS — R109 Unspecified abdominal pain: Secondary | ICD-10-CM | POA: Insufficient documentation

## 2011-10-15 NOTE — Patient Instructions (Signed)
Take 2 glasses of MiraLax when you get. 30 minutes later give yourself an enema. Return he called to [place] if these maneuvers did not alleviate her pain

## 2011-10-15 NOTE — Progress Notes (Signed)
Joe [name] and his wife came in today with a four-day history of some pain in the right side. He describes as deep and has been associated with bloating and constipation. He spoke with his primary care, Dr. Celesta Aver in [place] who directed her to our office.  He has noted constipation despite taking Colace. On examination his abdomen is relatively soft and had no rebound or guarding or masses. I feel no evidence of a hernia.  My advice is to stop by the pharmacy and pickup and enema and some MiraLax. A one to go home and drink 2 glasses of MiraLax and 30 minutes later taken enema. See if he doesn't relieve his pain and call our office this afternoon let us know if this helped. If not then we may going to obtain some laboratory and acute abdominal x-ray.    If things resolved and he can return p.r.n. Otherwise he may need to go to the emergency room it was too long for further evaluation.

## 2011-10-18 ENCOUNTER — Telehealth (INDEPENDENT_AMBULATORY_CARE_PROVIDER_SITE_OTHER): Payer: Self-pay | Admitting: General Surgery

## 2011-10-18 NOTE — Telephone Encounter (Signed)
The patients wife contacted the office stating that her husband still has not had a bowel movement since WED. She stated he left our office used the mirlax and an enema with no relief. He has not taken any mirilax or a stool softener since then. Advised the patient to have him continue with a stool softener and mirlax and also try to use some Milk of Magnesia. If he has not had a bowel movement today to take him to the ED for evaluation.

## 2011-11-02 ENCOUNTER — Other Ambulatory Visit: Payer: Self-pay | Admitting: *Deleted

## 2011-11-02 DIAGNOSIS — C183 Malignant neoplasm of hepatic flexure: Secondary | ICD-10-CM

## 2011-11-02 MED ORDER — GABAPENTIN 300 MG PO CAPS: 300.0000 mg | ORAL_CAPSULE | Freq: Three times a day (TID) | ORAL | Status: DC

## 2011-11-29 ENCOUNTER — Other Ambulatory Visit (HOSPITAL_BASED_OUTPATIENT_CLINIC_OR_DEPARTMENT_OTHER): Payer: Self-pay | Admitting: Lab

## 2011-11-29 ENCOUNTER — Ambulatory Visit (HOSPITAL_BASED_OUTPATIENT_CLINIC_OR_DEPARTMENT_OTHER): Payer: Self-pay | Admitting: Nurse Practitioner

## 2011-11-29 ENCOUNTER — Ambulatory Visit (HOSPITAL_BASED_OUTPATIENT_CLINIC_OR_DEPARTMENT_OTHER): Payer: Self-pay | Admitting: Lab

## 2011-11-29 ENCOUNTER — Telehealth: Payer: Self-pay | Admitting: Oncology

## 2011-11-29 VITALS — BP 128/86 | HR 75 | Temp 98.2°F | Ht 66.0 in | Wt 221.9 lb

## 2011-11-29 DIAGNOSIS — D649 Anemia, unspecified: Secondary | ICD-10-CM

## 2011-11-29 DIAGNOSIS — R10A1 Flank pain, right side: Secondary | ICD-10-CM

## 2011-11-29 DIAGNOSIS — C189 Malignant neoplasm of colon, unspecified: Secondary | ICD-10-CM

## 2011-11-29 DIAGNOSIS — R300 Dysuria: Secondary | ICD-10-CM

## 2011-11-29 DIAGNOSIS — R109 Unspecified abdominal pain: Secondary | ICD-10-CM

## 2011-11-29 DIAGNOSIS — D702 Other drug-induced agranulocytosis: Secondary | ICD-10-CM

## 2011-11-29 DIAGNOSIS — C183 Malignant neoplasm of hepatic flexure: Secondary | ICD-10-CM

## 2011-11-29 LAB — COMPREHENSIVE METABOLIC PANEL
Albumin: 4.2 g/dL (ref 3.5–5.2)
Alkaline Phosphatase: 88 U/L (ref 39–117)
BUN: 14 mg/dL (ref 6–23)
Chloride: 105 mEq/L (ref 96–112)
Glucose, Bld: 142 mg/dL — ABNORMAL HIGH (ref 70–99)
Potassium: 4.8 mEq/L (ref 3.5–5.3)
Total Bilirubin: 0.3 mg/dL (ref 0.3–1.2)
Total Protein: 6.6 g/dL (ref 6.0–8.3)

## 2011-11-29 LAB — URINALYSIS, MICROSCOPIC - CHCC
Bilirubin (Urine): NEGATIVE
Glucose: NEGATIVE g/dL
Ketones: NEGATIVE mg/dL
Leukocyte Esterase: NEGATIVE
pH: 5 (ref 4.6–8.0)

## 2011-11-29 NOTE — Telephone Encounter (Signed)
Sent pt to lab gv pt appts for [id number]

## 2011-11-29 NOTE — Progress Notes (Signed)
OFFICE PROGRESS NOTE  Interval history:  [name] returns as scheduled. He reports persistent painful neuropathy in the feet. He has neuropathy symptoms intermittently in the hands. He continues to have pain at the right abdominal incision. He takes Percocet as needed. For the past one week he has been experiencing pain at the right flank region. He also notes that it is painful to urinate. He denies hematuria. He has had no fever. He has had recent constipation. The constipation was relieved with several laxatives.   Objective: Blood pressure 128/86, pulse 75, temperature 98.2 F (36.8 C), temperature source Oral, height 5\' 6"  (1.676 m), weight 221 lb 14.4 oz (100.653 kg).  Oropharynx is without thrush or ulceration. No palpable cervical, supraclavicular, axillary or inguinal lymph nodes. Lungs with bilateral inspiratory wheezes. No respiratory distress. Regular cardiac rhythm. Abdomen is soft. Tender surrounding the right mid abdominal scar. No hepatomegaly. He is tender at the right flank region. Extremities are without edema.  Lab Results: Lab Results  Component Value Date   WBC 5.3 08/25/2011   HGB 13.0 08/25/2011   HCT 38.7 08/25/2011   MCV 86.7 08/25/2011   PLT 198 08/25/2011    Chemistry:    Chemistry      Component Value Date/Time   NA 141 08/30/2011 1327   NA 140 08/25/2011 0747   K 4.6 08/30/2011 1327   K 4.9* 08/25/2011 0747   CL 105 08/30/2011 1327   CL 100 08/25/2011 0747   CO2 25 08/30/2011 1327   CO2 28 08/25/2011 0747   BUN 16 08/30/2011 1327   BUN 21 08/25/2011 0747   CREATININE 1.14 08/30/2011 1327   CREATININE 1.4* 08/25/2011 0747      Component Value Date/Time   CALCIUM 9.6 08/30/2011 1327   CALCIUM 9.1 08/25/2011 0747   ALKPHOS 83 08/25/2011 0747   ALKPHOS 70 02/18/2011 1016   AST 51* 08/25/2011 0747   AST 36 02/18/2011 1016   ALT 31 02/18/2011 1016   BILITOT 0.40 08/25/2011 0747   BILITOT 0.3 02/18/2011 1016       Studies/Results: No results  found.  Medications: I have reviewed the patient's current medications.  Assessment/Plan:  1. Stage III (T3 N1) colon cancer status post right hemicolectomy 08/20/2010. 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 He began treatment with CAPOX on 09/24/2010. He completed cycle 8 beginning 02/18/2011. Restaging CTs of the chest, abdomen, and pelvis on 08/25/2011 showed no evidence of metastatic disease 2. Staging CT of the chest 09/17/2010 negative for evidence of metastatic disease. 3. Anemia likely related to preoperative gastrointestinal bleeding: Stable on labs 03/18/2011.  4. History of alcohol use. 5. History of gout with an acute flare at the foot 11/26/2010. 6. Chronic obstructive pulmonary disease. 7. Hypertension. 8. Remote history of a bleeding gastric ulcer. 9. Family history of cancer. Microsatellite instability testing returned with a high microsatellite genotype. 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 10. Right-sided abdominal pain surrounding the transverse incision, persistent. Question neuropathic pain related to surgery and oxaliplatin. 11. Acute neurotoxicity related to oxaliplatin manifested with leg weakness, ataxia, blurred vision following cycle 2 on 10/25/2010. The oxaliplatin was dose reduced by 50% with cycle 3 and diluted in a larger volume with administration over 4 hours. The acute neurotoxicity did not recur. 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 He has pain associated with the neuropathy. He continues Neurontin. 13. Surveillance colonoscopy 08/17/2011 by Dr. Sherryl Barters 2 small benign  polyps at the left colon. He should continue yearly colonoscopy with a history of hereditary non-polyposis colon cancer syndrome. 14. One week history of right flank pain and dysuria. We are checking a urinalysis today. He will contact the office if the pain persists.  Disposition-we will followup on the urinalysis  results. He will return for a followup visit in 3 months. He will contact the office in the interim as outlined above or with any other problems.  Plan reviewed with Dr. Truett Perna.   Lonna Cobb ANP/GNP-BC

## 2011-11-30 ENCOUNTER — Other Ambulatory Visit: Payer: Self-pay | Admitting: Nurse Practitioner

## 2011-11-30 ENCOUNTER — Telehealth: Payer: Self-pay | Admitting: Oncology

## 2011-11-30 DIAGNOSIS — R109 Unspecified abdominal pain: Secondary | ICD-10-CM

## 2011-11-30 NOTE — Telephone Encounter (Signed)
called pts lmovm that pt can have xray of the abd as a walking @ [facility]. asked pt to rtn call to confirm that she received appt.

## 2011-12-01 ENCOUNTER — Ambulatory Visit (HOSPITAL_COMMUNITY)
Admission: RE | Admit: 2011-12-01 | Discharge: 2011-12-01 | Disposition: A | Discharge status: Home / Self Care | Payer: Self-pay | Source: Ambulatory Visit | Attending: Nurse Practitioner | Admitting: Nurse Practitioner

## 2011-12-01 DIAGNOSIS — R109 Unspecified abdominal pain: Secondary | ICD-10-CM | POA: Insufficient documentation

## 2011-12-01 DIAGNOSIS — R319 Hematuria, unspecified: Secondary | ICD-10-CM | POA: Insufficient documentation

## 2011-12-02 ENCOUNTER — Telehealth: Payer: Self-pay | Admitting: *Deleted

## 2011-12-02 NOTE — Telephone Encounter (Signed)
Called pt with XRay results. Negative, per Dr. Truett Perna. Pt reports he is still having pain although not as pronounced. Pt will call office if this worsens.

## 2011-12-14 ENCOUNTER — Telehealth: Payer: Self-pay | Admitting: *Deleted

## 2011-12-14 NOTE — Telephone Encounter (Signed)
Patient reports the neuropathy in his hands is getting worse--pain and numbness and drops tools, no strength. Has filed for social security disability and they are asking for MD to dictate letter that he is not able to work due to his neuropathy/disability. Will forward request to MD.

## 2011-12-15 ENCOUNTER — Encounter: Payer: Self-pay | Admitting: Oncology

## 2011-12-23 ENCOUNTER — Telehealth: Payer: Self-pay | Admitting: *Deleted

## 2011-12-23 ENCOUNTER — Encounter: Payer: Self-pay | Admitting: Oncology

## 2011-12-23 NOTE — Telephone Encounter (Signed)
Called pt, letter will be mailed today.

## 2011-12-29 ENCOUNTER — Telehealth: Payer: Self-pay | Admitting: *Deleted

## 2011-12-29 NOTE — Telephone Encounter (Signed)
Call from pt reporting he saw his PCP today for mass in his R breast. Area is tender. Denies redness or drainage. PCP ordered sonogram. Pt is asking if he can be seen by Dr. Truett Perna for this. Concerned about cost, has assistance through [facility] and would rather have imaging done at [facility]. Will review with Dr. Truett Perna.

## 2011-12-29 NOTE — Telephone Encounter (Signed)
Reviewed earlier note with Dr. Truett Perna: Pt has Lynch syndrome, OK to schedule office visit 4/19 or 4/23. Will evaluate area and order imaging as needed.

## 2011-12-30 ENCOUNTER — Telehealth: Payer: Self-pay | Admitting: Oncology

## 2011-12-30 ENCOUNTER — Ambulatory Visit (HOSPITAL_BASED_OUTPATIENT_CLINIC_OR_DEPARTMENT_OTHER): Payer: Self-pay | Admitting: Nurse Practitioner

## 2011-12-30 VITALS — BP 138/82 | HR 82 | Temp 97.5°F | Ht 66.0 in | Wt 219.8 lb

## 2011-12-30 DIAGNOSIS — N63 Unspecified lump in unspecified breast: Secondary | ICD-10-CM

## 2011-12-30 DIAGNOSIS — C189 Malignant neoplasm of colon, unspecified: Secondary | ICD-10-CM

## 2011-12-30 DIAGNOSIS — N6310 Unspecified lump in the right breast, unspecified quadrant: Secondary | ICD-10-CM

## 2011-12-30 NOTE — Progress Notes (Signed)
OFFICE PROGRESS NOTE  Interval history:  [name] returns prior to scheduled followup for evaluation of a right breast mass. He initially noted a "knot" in the right breast 2 weeks ago with associated tenderness. He has not noted any definite change in size. He denies nipple discharge. There has been no redness. He denies fever. He reports a strong family history of breast cancer including his mother and multiple sisters. His primary physician prescribed cephalexin and referred him for an ultrasound.   Objective: Blood pressure 138/82, pulse 82, temperature 97.5 F (36.4 C), temperature source Oral, height 5\' 6"  (1.676 m), weight 219 lb 12.8 oz (99.701 kg).  Oropharynx is without thrush or ulceration. No palpable cervical, supraclavicular or left axillary lymph nodes. Approximate 2 cm right axillary lymph node. The right breast appears edematous. There is an approximate 2 cm tender mass located at the superior aspect of the aerola. No erythema. Lungs are clear. Regular cardiac rhythm. Abdomen soft. Tender surrounding the right mid abdominal scar. No hepatomegaly. Extremities are without edema.  Lab Results: Lab Results  Component Value Date   WBC 5.3 08/25/2011   HGB 13.0 08/25/2011   HCT 38.7 08/25/2011   MCV 86.7 08/25/2011   PLT 198 08/25/2011    Chemistry:    Chemistry      Component Value Date/Time   NA 139 11/29/2011 0952   NA 140 08/25/2011 0747   K 4.8 11/29/2011 0952   K 4.9* 08/25/2011 0747   CL 105 11/29/2011 0952   CL 100 08/25/2011 0747   CO2 24 11/29/2011 0952   CO2 28 08/25/2011 0747   BUN 14 11/29/2011 0952   BUN 21 08/25/2011 0747   CREATININE 1.07 11/29/2011 0952   CREATININE 1.4* 08/25/2011 0747      Component Value Date/Time   CALCIUM 9.4 11/29/2011 0952   CALCIUM 9.1 08/25/2011 0747   ALKPHOS 88 11/29/2011 0952   ALKPHOS 83 08/25/2011 0747   AST 50* 11/29/2011 0952   AST 51* 08/25/2011 0747   ALT 62* 11/29/2011 0952   BILITOT 0.3 11/29/2011 0952   BILITOT  0.40 08/25/2011 0747       Studies/Results: Dg Abd 1 View  12/01/2011  *RADIOLOGY REPORT*  Clinical Data: Right flank pain, hematuria  ABDOMEN - 1 VIEW  Comparison: CT abdomen pelvis of 08/25/2011  Findings: Supine views of the abdomen show both large and small bowel gas with no distention.  Surgical sutures are noted in the right abdomen.  No definite renal calculi are seen.  No calcifications are seen along the expected courses of the ureters.  IMPRESSION:  1.  Nonspecific bowel gas pattern.  No obstruction. 2.  No opaque calculi are noted.  Original Report Authenticated By: Juline Patch, M.D.    Medications: I have reviewed the patient's current medications.  Assessment/Plan:  1. Stage III (T3 N1) colon cancer status post right hemicolectomy 08/20/2010. 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 He began treatment with CAPOX on 09/24/2010. He completed cycle 8 beginning 02/18/2011. Restaging CTs of the chest, abdomen, and pelvis on 08/25/2011 showed no evidence of metastatic disease 2. Staging CT of the chest 09/17/2010 negative for evidence of metastatic disease. 3. Anemia likely related to preoperative gastrointestinal bleeding: Stable on labs 03/18/2011.  4. History of alcohol use. 5. History of gout with an acute flare at the foot 11/26/2010. 6. Chronic obstructive pulmonary disease. 7. Hypertension. 8. Remote history of a bleeding gastric ulcer. 9. Family history of cancer.  Microsatellite instability testing returned with a high microsatellite genotype. 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 10. Right-sided abdominal pain surrounding the transverse incision, persistent. Question neuropathic pain related to surgery and oxaliplatin. 11. Acute neurotoxicity related to oxaliplatin manifested with leg weakness, ataxia, blurred vision following cycle 2 on 10/25/2010. The oxaliplatin was dose reduced by 50% with cycle 3  and diluted in a larger volume with administration over 4 hours. The acute neurotoxicity did not recur. 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 He has pain associated with the neuropathy. He continues Neurontin. 13. Surveillance colonoscopy 08/17/2011 by Dr. Sherryl Barters 2 small benign polyps at the left colon. He should continue yearly colonoscopy with a history of hereditary non-polyposis colon cancer syndrome. 14. Right flank pain and dysuria 11/29/2011. Urinalysis was remarkable for too numerous to count red cells. Abdominal x-ray showed no renal calculi. He did not complain of right flank pain or dysuria at today's visit. 15. Right breast mass and palpable right axillary lymph node.  Disposition-we are referring [name] for a diagnostic mammogram and ultrasound. We will contact him once results are available. He is scheduled to return for a followup visit in June of this year. We will adjust that appointment accordingly pending the upcoming studies.  Patient seen with Dr. Truett Perna.  Lonna Cobb ANP/GNP-BC

## 2011-12-30 NOTE — Telephone Encounter (Signed)
called wife with appt  aom

## 2011-12-30 NOTE — Telephone Encounter (Signed)
lmonvm for pt @ both home/cell phones re appt for today 4/18 @ 3:15 pm.

## 2012-01-03 ENCOUNTER — Ambulatory Visit
Admission: RE | Admit: 2012-01-03 | Discharge: 2012-01-03 | Disposition: A | Discharge status: Home / Self Care | Payer: Self-pay | Source: Ambulatory Visit | Attending: Nurse Practitioner | Admitting: Nurse Practitioner

## 2012-01-03 ENCOUNTER — Other Ambulatory Visit: Payer: Self-pay | Admitting: Nurse Practitioner

## 2012-01-03 DIAGNOSIS — N6310 Unspecified lump in the right breast, unspecified quadrant: Secondary | ICD-10-CM

## 2012-01-06 ENCOUNTER — Other Ambulatory Visit: Payer: Self-pay

## 2012-01-21 ENCOUNTER — Telehealth (INDEPENDENT_AMBULATORY_CARE_PROVIDER_SITE_OTHER): Payer: Self-pay | Admitting: General Surgery

## 2012-01-21 NOTE — Telephone Encounter (Signed)
Patient called in stating still having abdominal cramping. He is moving his bowels, but they are loose. He is using enemas and laxatives. He is just having a lot of crampy abdominal pain. He is status post colon surgery 08/20/2010. I advise he needs to see his primary MD or a gastroenterologist since he is this far from surgery. He will make an appt with his PCP and call us if needed. I also advised I would send this to Dr Daphine Deutscher in case he did want to evaluate him or have any suggestions on testing per patient request.

## 2012-01-23 ENCOUNTER — Emergency Department (HOSPITAL_COMMUNITY): Payer: Medicaid Other

## 2012-01-23 ENCOUNTER — Inpatient Hospital Stay (HOSPITAL_COMMUNITY)
Admission: EM | Admit: 2012-01-23 | Discharge: 2012-02-04 | DRG: 394 | Disposition: A | Discharge status: Home / Self Care | Payer: Medicaid Other | Attending: Internal Medicine | Admitting: Internal Medicine

## 2012-01-23 ENCOUNTER — Encounter (HOSPITAL_COMMUNITY): Payer: Self-pay | Admitting: *Deleted

## 2012-01-23 DIAGNOSIS — I10 Essential (primary) hypertension: Secondary | ICD-10-CM

## 2012-01-23 DIAGNOSIS — J4489 Other specified chronic obstructive pulmonary disease: Secondary | ICD-10-CM | POA: Diagnosis present

## 2012-01-23 DIAGNOSIS — E785 Hyperlipidemia, unspecified: Secondary | ICD-10-CM | POA: Diagnosis present

## 2012-01-23 DIAGNOSIS — D638 Anemia in other chronic diseases classified elsewhere: Secondary | ICD-10-CM | POA: Diagnosis present

## 2012-01-23 DIAGNOSIS — F329 Major depressive disorder, single episode, unspecified: Secondary | ICD-10-CM

## 2012-01-23 DIAGNOSIS — F1010 Alcohol abuse, uncomplicated: Secondary | ICD-10-CM | POA: Diagnosis present

## 2012-01-23 DIAGNOSIS — R1031 Right lower quadrant pain: Secondary | ICD-10-CM | POA: Diagnosis present

## 2012-01-23 DIAGNOSIS — K633 Ulcer of intestine: Secondary | ICD-10-CM | POA: Diagnosis present

## 2012-01-23 DIAGNOSIS — F32A Depression, unspecified: Secondary | ICD-10-CM

## 2012-01-23 DIAGNOSIS — K529 Noninfective gastroenteritis and colitis, unspecified: Secondary | ICD-10-CM

## 2012-01-23 DIAGNOSIS — C183 Malignant neoplasm of hepatic flexure: Secondary | ICD-10-CM

## 2012-01-23 DIAGNOSIS — J449 Chronic obstructive pulmonary disease, unspecified: Secondary | ICD-10-CM

## 2012-01-23 DIAGNOSIS — R1013 Epigastric pain: Secondary | ICD-10-CM | POA: Diagnosis present

## 2012-01-23 DIAGNOSIS — F411 Generalized anxiety disorder: Secondary | ICD-10-CM | POA: Diagnosis present

## 2012-01-23 DIAGNOSIS — K5289 Other specified noninfective gastroenteritis and colitis: Secondary | ICD-10-CM | POA: Diagnosis present

## 2012-01-23 DIAGNOSIS — R7402 Elevation of levels of lactic acid dehydrogenase (LDH): Secondary | ICD-10-CM | POA: Diagnosis present

## 2012-01-23 DIAGNOSIS — J45909 Unspecified asthma, uncomplicated: Secondary | ICD-10-CM | POA: Diagnosis present

## 2012-01-23 DIAGNOSIS — Z85038 Personal history of other malignant neoplasm of large intestine: Secondary | ICD-10-CM

## 2012-01-23 DIAGNOSIS — K559 Vascular disorder of intestine, unspecified: Principal | ICD-10-CM | POA: Diagnosis present

## 2012-01-23 DIAGNOSIS — K5730 Diverticulosis of large intestine without perforation or abscess without bleeding: Secondary | ICD-10-CM | POA: Diagnosis present

## 2012-01-23 DIAGNOSIS — F3289 Other specified depressive episodes: Secondary | ICD-10-CM | POA: Diagnosis present

## 2012-01-23 DIAGNOSIS — K3189 Other diseases of stomach and duodenum: Secondary | ICD-10-CM | POA: Diagnosis present

## 2012-01-23 DIAGNOSIS — R7401 Elevation of levels of liver transaminase levels: Secondary | ICD-10-CM

## 2012-01-23 DIAGNOSIS — Z9221 Personal history of antineoplastic chemotherapy: Secondary | ICD-10-CM

## 2012-01-23 DIAGNOSIS — F17200 Nicotine dependence, unspecified, uncomplicated: Secondary | ICD-10-CM | POA: Diagnosis present

## 2012-01-23 DIAGNOSIS — Z9049 Acquired absence of other specified parts of digestive tract: Secondary | ICD-10-CM

## 2012-01-23 DIAGNOSIS — F419 Anxiety disorder, unspecified: Secondary | ICD-10-CM | POA: Diagnosis present

## 2012-01-23 LAB — DIFFERENTIAL
Eosinophils Absolute: 0.1 10*3/uL (ref 0.0–0.7)
Lymphs Abs: 1.5 10*3/uL (ref 0.7–4.0)
Monocytes Relative: 10 % (ref 3–12)
Neutro Abs: 3.5 10*3/uL (ref 1.7–7.7)
Neutrophils Relative %: 61 % (ref 43–77)

## 2012-01-23 LAB — COMPREHENSIVE METABOLIC PANEL
ALT: 152 U/L — ABNORMAL HIGH (ref 0–53)
Albumin: 4 g/dL (ref 3.5–5.2)
Alkaline Phosphatase: 110 U/L (ref 39–117)
BUN: 16 mg/dL (ref 6–23)
Chloride: 99 mEq/L (ref 96–112)
GFR calc Af Amer: 83 mL/min — ABNORMAL LOW (ref >90)
Glucose, Bld: 107 mg/dL — ABNORMAL HIGH (ref 70–99)
Potassium: 4.2 mEq/L (ref 3.5–5.1)
Sodium: 133 mEq/L — ABNORMAL LOW (ref 135–145)
Total Bilirubin: 0.4 mg/dL (ref 0.3–1.2)
Total Protein: 7.7 g/dL (ref 6.0–8.3)

## 2012-01-23 LAB — CBC
Hemoglobin: 14 g/dL (ref 13.0–17.0)
Platelets: 226 10*3/uL (ref 150–400)
RBC: 4.85 MIL/uL (ref 4.22–5.81)
WBC: 5.7 10*3/uL (ref 4.0–10.5)

## 2012-01-23 LAB — URINALYSIS, ROUTINE W REFLEX MICROSCOPIC
Bilirubin Urine: NEGATIVE
Hgb urine dipstick: NEGATIVE
Ketones, ur: NEGATIVE mg/dL
Nitrite: NEGATIVE
Specific Gravity, Urine: 1.019 (ref 1.005–1.030)
pH: 5.5 (ref 5.0–8.0)

## 2012-01-23 LAB — LIPASE, BLOOD: Lipase: 28 U/L (ref 11–59)

## 2012-01-23 MED ORDER — ONDANSETRON HCL 4 MG/2ML IJ SOLN
4.0000 mg | Freq: Four times a day (QID) | INTRAMUSCULAR | Status: DC | PRN
  Administered 2012-01-24 – 2012-01-30 (×2): 4 mg via INTRAVENOUS
  Filled 2012-01-23 (×2): qty 2

## 2012-01-23 MED ORDER — ALLOPURINOL 300 MG PO TABS
300.0000 mg | ORAL_TABLET | Freq: Every day | ORAL | Status: DC
  Administered 2012-01-24 – 2012-02-04 (×12): 300 mg via ORAL
  Filled 2012-01-23 (×12): qty 1

## 2012-01-23 MED ORDER — CIPROFLOXACIN IN D5W 400 MG/200ML IV SOLN
400.0000 mg | Freq: Two times a day (BID) | INTRAVENOUS | Status: DC
  Administered 2012-01-24: 400 mg via INTRAVENOUS
  Filled 2012-01-23 (×2): qty 200

## 2012-01-23 MED ORDER — LISINOPRIL 20 MG PO TABS
20.0000 mg | ORAL_TABLET | Freq: Every day | ORAL | Status: DC
Start: 2012-01-24 — End: 2012-02-04
  Administered 2012-01-24 – 2012-02-04 (×12): 20 mg via ORAL
  Filled 2012-01-23 (×12): qty 1

## 2012-01-23 MED ORDER — IOHEXOL 300 MG/ML  SOLN
100.0000 mL | Freq: Once | INTRAMUSCULAR | Status: AC | PRN
  Administered 2012-01-23: 100 mL via INTRAVENOUS

## 2012-01-23 MED ORDER — ALPRAZOLAM 1 MG PO TABS
1.0000 mg | ORAL_TABLET | Freq: Two times a day (BID) | ORAL | Status: DC | PRN
  Administered 2012-01-24 – 2012-02-04 (×22): 1 mg via ORAL
  Filled 2012-01-23 (×23): qty 1

## 2012-01-23 MED ORDER — ACETAMINOPHEN 325 MG PO TABS: 650.0000 mg | ORAL_TABLET | Freq: Four times a day (QID) | ORAL | Status: DC | PRN

## 2012-01-23 MED ORDER — METRONIDAZOLE IN NACL 5-0.79 MG/ML-% IV SOLN: 500.0000 mg | Freq: Once | INTRAVENOUS | Status: DC

## 2012-01-23 MED ORDER — DOCUSATE SODIUM 100 MG PO CAPS
100.0000 mg | ORAL_CAPSULE | Freq: Every day | ORAL | Status: DC
  Filled 2012-01-23: qty 1

## 2012-01-23 MED ORDER — MORPHINE SULFATE 2 MG/ML IJ SOLN
2.0000 mg | INTRAMUSCULAR | Status: DC | PRN
Start: 2012-01-23 — End: 2012-01-23
  Administered 2012-01-23: 2 mg via INTRAVENOUS
  Filled 2012-01-23: qty 1

## 2012-01-23 MED ORDER — SODIUM CHLORIDE 0.9 % IV SOLN
INTRAVENOUS | Status: AC
  Administered 2012-01-24: 04:00:00 via INTRAVENOUS

## 2012-01-23 MED ORDER — ENOXAPARIN SODIUM 60 MG/0.6ML ~~LOC~~ SOLN
50.0000 mg | Freq: Every day | SUBCUTANEOUS | Status: DC
  Administered 2012-01-24 – 2012-02-03 (×11): 50 mg via SUBCUTANEOUS
  Filled 2012-01-23 (×12): qty 0.6

## 2012-01-23 MED ORDER — CIPROFLOXACIN IN D5W 400 MG/200ML IV SOLN
400.0000 mg | Freq: Once | INTRAVENOUS | Status: DC
  Administered 2012-01-23: 400 mg via INTRAVENOUS
  Filled 2012-01-23: qty 200

## 2012-01-23 MED ORDER — SODIUM CHLORIDE 0.9 % IV SOLN
Freq: Once | INTRAVENOUS | Status: AC
  Administered 2012-01-23: 17:00:00 via INTRAVENOUS

## 2012-01-23 MED ORDER — ALBUTEROL SULFATE (5 MG/ML) 0.5% IN NEBU
2.5000 mg | INHALATION_SOLUTION | RESPIRATORY_TRACT | Status: DC
  Administered 2012-01-23: 2.5 mg via RESPIRATORY_TRACT
  Filled 2012-01-23: qty 0.5

## 2012-01-23 MED ORDER — LISINOPRIL-HYDROCHLOROTHIAZIDE 20-25 MG PO TABS: 1.0000 | ORAL_TABLET | Freq: Every day | ORAL | Status: DC

## 2012-01-23 MED ORDER — PANTOPRAZOLE SODIUM 20 MG PO TBEC
20.0000 mg | DELAYED_RELEASE_TABLET | Freq: Every day | ORAL | Status: DC
  Administered 2012-01-24 – 2012-02-03 (×12): 20 mg via ORAL
  Filled 2012-01-23 (×14): qty 1

## 2012-01-23 MED ORDER — HYDROMORPHONE HCL PF 1 MG/ML IJ SOLN
1.0000 mg | Freq: Once | INTRAMUSCULAR | Status: AC
  Administered 2012-01-23: 1 mg via INTRAVENOUS
  Filled 2012-01-23: qty 1

## 2012-01-23 MED ORDER — METRONIDAZOLE IN NACL 5-0.79 MG/ML-% IV SOLN
500.0000 mg | Freq: Three times a day (TID) | INTRAVENOUS | Status: DC
  Administered 2012-01-24 (×2): 500 mg via INTRAVENOUS
  Filled 2012-01-23 (×4): qty 100

## 2012-01-23 MED ORDER — HYDROMORPHONE HCL PF 1 MG/ML IJ SOLN
1.0000 mg | INTRAMUSCULAR | Status: DC | PRN
  Administered 2012-01-23 – 2012-02-03 (×72): 1 mg via INTRAVENOUS
  Filled 2012-01-23 (×74): qty 1

## 2012-01-23 MED ORDER — BUDESONIDE-FORMOTEROL FUMARATE 80-4.5 MCG/ACT IN AERO
2.0000 | INHALATION_SPRAY | Freq: Two times a day (BID) | RESPIRATORY_TRACT | Status: DC
  Administered 2012-01-24 – 2012-02-04 (×23): 2 via RESPIRATORY_TRACT
  Filled 2012-01-23: qty 6.9

## 2012-01-23 MED ORDER — GABAPENTIN 300 MG PO CAPS
300.0000 mg | ORAL_CAPSULE | Freq: Three times a day (TID) | ORAL | Status: DC
  Administered 2012-01-24 – 2012-02-04 (×34): 300 mg via ORAL
  Filled 2012-01-23 (×37): qty 1

## 2012-01-23 MED ORDER — IPRATROPIUM BROMIDE 0.02 % IN SOLN
0.5000 mg | RESPIRATORY_TRACT | Status: DC
  Administered 2012-01-23: 0.5 mg via RESPIRATORY_TRACT
  Filled 2012-01-23: qty 2.5

## 2012-01-23 MED ORDER — ALBUTEROL SULFATE HFA 108 (90 BASE) MCG/ACT IN AERS
2.0000 | INHALATION_SPRAY | Freq: Four times a day (QID) | RESPIRATORY_TRACT | Status: DC | PRN
  Administered 2012-01-26 – 2012-02-03 (×13): 2 via RESPIRATORY_TRACT
  Filled 2012-01-23: qty 6.7

## 2012-01-23 MED ORDER — ONDANSETRON HCL 4 MG/2ML IJ SOLN
4.0000 mg | Freq: Once | INTRAMUSCULAR | Status: AC
  Administered 2012-01-23: 4 mg via INTRAVENOUS
  Filled 2012-01-23: qty 2

## 2012-01-23 MED ORDER — HYDROCODONE-ACETAMINOPHEN 5-325 MG PO TABS
1.0000 | ORAL_TABLET | ORAL | Status: DC | PRN
  Administered 2012-01-24 (×2): 1 via ORAL
  Administered 2012-01-25 – 2012-01-26 (×6): 2 via ORAL
  Filled 2012-01-23: qty 2
  Filled 2012-01-23: qty 1
  Filled 2012-01-23 (×6): qty 2
  Filled 2012-01-23: qty 1

## 2012-01-23 MED ORDER — DOCUSATE SODIUM 100 MG PO CAPS
100.0000 mg | ORAL_CAPSULE | Freq: Two times a day (BID) | ORAL | Status: DC
  Administered 2012-01-24 – 2012-01-25 (×4): 100 mg via ORAL
  Filled 2012-01-23 (×5): qty 1

## 2012-01-23 MED ORDER — HYDROCHLOROTHIAZIDE 25 MG PO TABS
25.0000 mg | ORAL_TABLET | Freq: Every day | ORAL | Status: DC
  Administered 2012-01-24 – 2012-02-02 (×10): 25 mg via ORAL
  Filled 2012-01-23 (×10): qty 1

## 2012-01-23 MED ORDER — MORPHINE SULFATE 4 MG/ML IJ SOLN
4.0000 mg | Freq: Once | INTRAMUSCULAR | Status: AC
  Administered 2012-01-23: 4 mg via INTRAVENOUS
  Filled 2012-01-23: qty 1

## 2012-01-23 MED ORDER — CITALOPRAM HYDROBROMIDE 40 MG PO TABS
40.0000 mg | ORAL_TABLET | Freq: Every day | ORAL | Status: DC
  Administered 2012-01-24 – 2012-02-04 (×12): 40 mg via ORAL
  Filled 2012-01-23 (×12): qty 1

## 2012-01-23 MED ORDER — ENOXAPARIN SODIUM 30 MG/0.3ML ~~LOC~~ SOLN: 30.0000 mg | SUBCUTANEOUS | Status: DC

## 2012-01-23 MED ORDER — ACETAMINOPHEN 650 MG RE SUPP: 650.0000 mg | Freq: Four times a day (QID) | RECTAL | Status: DC | PRN

## 2012-01-23 MED ORDER — ONDANSETRON HCL 4 MG PO TABS: 4.0000 mg | ORAL_TABLET | Freq: Four times a day (QID) | ORAL | Status: DC | PRN

## 2012-01-23 NOTE — ED Notes (Signed)
Pt from home with reports of abdominal pain/cramping around umbilical area and nausea since Tuesday as well as constipation which was relieved with laxatives, pt reports that stool is loose at present. Pt also endorses hx of intestinal blockage and colon cancer which was surgically removed.

## 2012-01-23 NOTE — Progress Notes (Signed)
Rx Brief Lovenox note  Wt = 99 kg BMI = 34.3  CrCl=85 ml/min  Adjust Lovenox to 50mg  sq daily for dvt prophylaxis in pt with BMI>30.  Joe [name] 01/23/2012 11:05 PM

## 2012-01-23 NOTE — ED Provider Notes (Signed)
History     CSN: [id number]  Arrival date & time 01/23/12  1517   First MD Initiated Contact with Patient 01/23/12 1558      Chief Complaint  Patient presents with  . Abdominal Pain    umbilical area  . Nausea    (Consider location/radiation/quality/duration/timing/severity/associated sxs/prior treatment) HPI  51yoM h/o colon ca s/p right hemicolectomy, COPD, etoh abuse, hypertension, hyperlipidemia presents with abdominal pain. The patient states that he is experienced intermittent abdominal pain since Tuesday, 4 days ago. He describes it as sharp in nature. Periumbilical right upper quadrant region. He denies fevers, chills. He does complain of nausea without vomiting. He states that he has loose stool currently. He has taken Imodium in the past. He has not take any pain medication. He rates his pain as a 6/10 at this time. Denies hematuria/dysuria/freq/urgency    ED Notes, ED Provider Notes from 01/23/12 0000 to 01/23/12 15:46:17       Alvina Chou, RN 01/23/2012 15:44      Pt from home with reports of abdominal pain/cramping around umbilical area and nausea since Tuesday as well as constipation which was relieved with laxatives, pt reports that stool is loose at present. Pt also endorses hx of intestinal blockage and colon cancer which was surgically removed.    Past Medical History  Diagnosis Date  . Hyperlipidemia   . Hypertension   . GERD (gastroesophageal reflux disease)   . COPD (chronic obstructive pulmonary disease)   . Cancer     colon  . Depression   . Anxiety     Past Surgical History  Procedure Date  . Colon surgery 08/2010    colon cancer  . Endoscopic insertion peritoneal catheter port     placement  . Esophagogastroduodenoscopy     ulcers  . Colonoscopy 08/17/2011    Procedure: COLONOSCOPY;  Surgeon: Petra Kuba, MD;  Location: [facility];  Service: Endoscopy;  Laterality: N/A;    Family History  Problem Relation Age of Onset  . Anesthesia  problems Neg Hx   . Hypotension Neg Hx   . Malignant hyperthermia Neg Hx   . Pseudochol deficiency Neg Hx     History  Substance Use Topics  . Smoking status: Current Everyday Smoker -- 1.0 packs/day    Types: Cigarettes  . Smokeless tobacco: Never Used  . Alcohol Use: 16.8 oz/week    28 Cans of beer per week     daily      Review of Systems  All other systems reviewed and are negative.   except as noted HPI   Allergies  Review of patient's allergies indicates no known allergies.  Home Medications   Current Outpatient Rx  Name Route Sig Dispense Refill  . ALBUTEROL SULFATE HFA 108 (90 BASE) MCG/ACT IN AERS Inhalation Inhale 2 puffs into the lungs every 6 (six) hours as needed. Wheezing or shortness of breath.    . ALLOPURINOL 300 MG PO TABS Oral Take 300 mg by mouth daily.      Marland Kitchen ALPRAZOLAM 1 MG PO TABS Oral Take 1 mg by mouth 2 (two) times daily as needed. Anxiety.    . BUDESONIDE-FORMOTEROL FUMARATE 80-4.5 MCG/ACT IN AERO Inhalation Inhale 2 puffs into the lungs 2 (two) times daily.      . CEPHALEXIN 500 MG PO CAPS Oral Take 500 mg by mouth 3 (three) times daily.    Marland Kitchen CITALOPRAM HYDROBROMIDE 40 MG PO TABS Oral Take 40 mg by mouth daily.      Marland Kitchen  DOCUSATE SODIUM 100 MG PO CAPS Oral Take 100 mg by mouth daily.    Marland Kitchen GABAPENTIN 300 MG PO CAPS Oral Take 1 capsule (300 mg total) by mouth 3 (three) times daily. 90 capsule 1  . LISINOPRIL-HYDROCHLOROTHIAZIDE 20-25 MG PO TABS Oral Take 1 tablet by mouth daily.      Marland Kitchen LORAZEPAM 1 MG PO TABS Oral Take 1 mg by mouth every morning.    . OXYCODONE-ACETAMINOPHEN 5-325 MG PO TABS Oral Take 1 tablet by mouth 2 (two) times daily as needed. Pain.    Marland Kitchen PANTOPRAZOLE SODIUM 20 MG PO TBEC Oral Take 20 mg by mouth daily.      BP 127/85  Pulse 91  Temp(Src) 98.3 F (36.8 C) (Oral)  Resp 20  Ht 5\' 7"  (1.702 m)  Wt 219 lb (99.338 kg)  BMI 34.30 kg/m2  SpO2 97%  Physical Exam  Nursing note and vitals reviewed. Constitutional: He is  oriented to person, place, and time. He appears well-developed and well-nourished. No distress.  HENT:  Head: Atraumatic.  Mouth/Throat: Oropharynx is clear and moist.  Eyes: Conjunctivae are normal. Pupils are equal, round, and reactive to light.  Neck: Neck supple.  Cardiovascular: Normal rate, regular rhythm, normal heart sounds and intact distal pulses.  Exam reveals no gallop and no friction rub.   No murmur heard. Pulmonary/Chest: Effort normal. No respiratory distress. He has wheezes. He has no rales.       Diffuse exp wheeze  Abdominal: Soft. Bowel sounds are normal. There is tenderness. There is no rebound and no guarding.       Diffuse abd ttp > RUQ  Musculoskeletal: Normal range of motion. He exhibits no edema and no tenderness.  Neurological: He is alert and oriented to person, place, and time.  Skin: Skin is warm and dry.  Psychiatric: He has a normal mood and affect.    ED Course  Procedures (including critical care time)  Labs Reviewed  COMPREHENSIVE METABOLIC PANEL - Abnormal; Notable for the following:    Sodium 133 (*)    Glucose, Bld 107 (*)    AST 111 (*)    ALT 152 (*)    GFR calc non Af Amer 72 (*)    GFR calc Af Amer 83 (*)    All other components within normal limits  CBC  DIFFERENTIAL  LIPASE, BLOOD  URINALYSIS, ROUTINE W REFLEX MICROSCOPIC   US Abdomen Complete  01/23/2012  *RADIOLOGY REPORT*  Clinical Data:  Right upper quadrant abdominal pain for 5 days.  COMPLETE ABDOMINAL ULTRASOUND  Comparison:  CT abdomen 01/23/2012.  Findings:  Gallbladder:  No gallstones, gallbladder wall thickening, or pericholecystic fluid.  Common bile duct:  Nonvisualization of the distal common bile duct due to bowel gas.  Maximal diameter visualized is 4 mm, normal.  Liver:  Heterogeneous liver.  Echogenic when compared to the adjacent right kidney compatible with hepatic steatosis.  IVC:  Appears normal.  Pancreas:  Not visualized due to overlying bowel gas.  Spleen:  12  cm.  Normal echotexture.  Right Kidney:  11.5 cm. Normal echotexture.  Normal central sinus echo complex.  No calculi or hydronephrosis.  Left Kidney:  12.6 cm. Normal echotexture.  Normal central sinus echo complex.  No calculi or hydronephrosis.  Abdominal aorta:  Atherosclerotic plaque.  No aneurysm.  IMPRESSION: Negative for cholelithiasis or cholecystitis.  No dilation of the visualized common bile duct.  Fatty liver.  Original Report Authenticated By: Andreas Newport, M.D.   Ct  Abdomen Pelvis W Contrast  01/23/2012  *RADIOLOGY REPORT*  Clinical Data: Abdominal pain, nausea, history of small bowel obstruction  CT ABDOMEN AND PELVIS WITH CONTRAST  Technique:  Multidetector CT imaging of the abdomen and pelvis was performed following the standard protocol during bolus administration of intravenous contrast.  Contrast: OMNIPAQUE IOHEXOL 300 MG/ML  SOLN  Comparison: 08/25/2011  Findings: Lung bases are unremarkable.  Sagittal images of the spine shows no destructive bony lesions. Mild degenerative changes lumbar spine.  Liver, spleen, pancreas and adrenal glands are unremarkable.  No calcified gallstones are noted within gallbladder.  In axial image 42 there is a mesenteric lymph node just anterior to right kidney the inferior to duodenum measures 2.2 x 1.3 cm.  This is  pathologic by size criteria.  Metastatic disease cannot be excluded.  Further evaluation possible PET scan is recommended. Small mesenteric lymph nodes are noted.  There is a second mesenteric lymph node in the right upper mesentery axial image 38 measures 10 x 9 mm borderline enlarged.  Again noted status post right hemicolectomy.  There is mild focal thickening of the proximal colonic wall at the level of the enterocolonic anastomosis.  There is mild stranding of the surrounding fat.  This is best seen in the coronal image 35. Findings are highly suspicious for mild focal colitis.  No definite colonic mass is noted at this level.   Follow-up examination after appropriate treatment is recommended to assure resolution.  There is no small bowel or colonic obstruction.  Mild atherosclerotic calcifications of the distal abdominal aorta and the iliac arteries.  No aortic aneurysm.  The left colon and sigmoid colon are unremarkable.  Urinary bladder is unremarkable.  Enhanced kidneys are symmetrical in size.  No hydronephrosis or hydroureter.  Delayed renal images shows bilateral renal symmetrical excretion.  Bilateral visualized proximal ureter is unremarkable.  Prostate gland and seminal vesicles are unremarkable.  No destructive bony lesions are noted within pelvis.  IMPRESSION:  1.  There is again noted status post right hemicolectomy.  Mild focal thickening  of the proximal wall colonic wall in the right upper abdomen at the site of the enteric colonic anastomosis. There is mild stranding of the surrounding fat.  Findings are suspicious for mild focal colitis.  No definite colonic mass is noted at this level. 2.  There is a lymph node in the right lower quadrant mesentery axial image 38 measures 10 x 9 mm.  This is borderline enlarged may be reactive.  A second lymph node in the right upper mesentery axial image 42 measures 2.2 x 1.3 cm.  This is pathologic by size criteria.  Metastatic disease cannot be excluded. Further evaluation possible PET scan is recommended. 3.  No small bowel or colonic obstruction. 4.  No destructive bony lesions are noted.  Original Report Authenticated By: Natasha Mead, M.D.     1. Colitis   2. Alcohol abuse   3. COPD (chronic obstructive pulmonary disease)     MDM  PW abdominal pain. Diffuse wheezing on exam, some improvement after duoneb. He was never complaining of shortness of breath. VSS. Found to have mild transaminitis, likely 2/2 etoh use. CT A/P significant for focal colitis. LAD. He has been given morphine 4mg , dilaudid 3mg  with pain uncontrolled in the ED. Cipro/flagyl ordered. He will be admitted  to [facility] for pain control. He will need follow up for pathologically enlarged abdominal LN, especially in light of his h/o colon cancer. Marland Kitchen  Forbes Cellar, MD 01/23/12 2153

## 2012-01-23 NOTE — ED Notes (Signed)
Patient transported to CT 

## 2012-01-23 NOTE — ED Notes (Signed)
Variable stomach pain since Tuesday- LBM yesterday after laxative use- pt reports passing gas yesterday but not today- hypoactive BS present

## 2012-01-23 NOTE — ED Notes (Signed)
US in progress in room.

## 2012-01-23 NOTE — H&P (Addendum)
PCP:  Deloris Ping, MD, MD   DOA:  01/23/2012  3:17 PM  Chief Complaint:  Abdominal pain  HPI: 51 year old male with history of anxiety and depression, HTN, alcohol abuse (about 6 pack beer a day), asthma, colon cancer status post right hemicolectomy (08/2008; chemotherapy completed 02/2011) who presented to ED with complaints of nausea  started about 4-5 days prior to admission associated with upper/mid abdominal pain. Pain is rated  9/10 in intensity, non radiating with no associated or aggrevating factors. Patient reported having loose stools for the past f 2-3 days but no actual diarrhea. Patient took imodium which per patient helped. Patient reports no fever or chills, no blood in stool or urine. No complaints of chest pain, no shortness of breath, no cough.   Assessment/Plan  Principal Problem:   *COLITIS - patient was started on cipro and flagyl in ED - we will continue this regimen - per patient request we will advance diet to clear liquid - continue IV fluids NS @ 100 cc/hr for next 12 hours - continue zofran for nausea and/or vomiting - continue analgesia with dilaudid 1 mg  PRN IV every 3 hours  Active Problems:   TRANSAMINITIS - unclear etiology - perhaps secondary to alcohol abuse  - ALT > AST; will obtain acute hepatits panel - abdominal ultrasound unremarkable   ASTHMA - stable - continue albuterol HFA as needed - continue symbicort - oxygen via nasal canula if needed to keep O2 saturation above 90%   HTN (HYPERTENSION) - continue home medication; lisinopril-Hctz   ANXIETY AND DEPRESSION - continue home medication includin alprazolam and celexa  HISTORY OF ALCOHOL ABUSE - provided counseling for alcohol abuse cessation  DVT PROPHYLAXIS - lovenox subQ  CODE STATUS - full code  EDUCATION  - test results and diagnostic studies were discussed with patient and pt's family who was present at the bedside - patient and family have verbalized the  understanding - questions were answered at the bedside and contact information was provided for additional questions or concerns  Allergies: No Known Allergies  Prior to Admission medications   Medication Sig Start Date End Date Taking? Authorizing Provider  albuterol (PROVENTIL HFA;VENTOLIN HFA) 108 (90 BASE) MCG/ACT inhaler Inhale 2 puffs into the lungs every 6 (six) hours as needed. Wheezing or shortness of breath.   Yes Historical Provider, MD  allopurinol (ZYLOPRIM) 300 MG tablet Take 300 mg by mouth daily.     Yes Historical Provider, MD  ALPRAZolam Prudy Feeler) 1 MG tablet Take 1 mg by mouth 2 (two) times daily as needed. Anxiety.   Yes Historical Provider, MD  budesonide-formoterol (SYMBICORT) 80-4.5 MCG/ACT inhaler Inhale 2 puffs into the lungs 2 (two) times daily.     Yes Historical Provider, MD  cephALEXin (KEFLEX) 500 MG capsule Take 500 mg by mouth 3 (three) times daily.   Yes Historical Provider, MD  citalopram (CELEXA) 40 MG tablet Take 40 mg by mouth daily.     Yes Historical Provider, MD  docusate sodium (COLACE) 100 MG capsule Take 100 mg by mouth daily.   Yes Historical Provider, MD  gabapentin (NEURONTIN) 300 MG capsule Take 1 capsule (300 mg total) by mouth 3 (three) times daily. 11/02/11  Yes Ladene Artist, MD  lisinopril-hydrochlorothiazide (PRINZIDE,ZESTORETIC) 20-25 MG per tablet Take 1 tablet by mouth daily.     Yes Historical Provider, MD  LORazepam (ATIVAN) 1 MG tablet Take 1 mg by mouth every morning. 08/25/11  Yes Ladene Artist, MD  oxyCODONE-acetaminophen (PERCOCET) 5-325 MG per tablet Take 1 tablet by mouth 2 (two) times daily as needed. Pain.   Yes Historical Provider, MD  pantoprazole (PROTONIX) 20 MG tablet Take 20 mg by mouth daily.   Yes Historical Provider, MD    Past Medical History  Diagnosis Date  . Hyperlipidemia   . Hypertension   . GERD (gastroesophageal reflux disease)   . COPD (chronic obstructive pulmonary disease)   . Cancer     colon  .  Depression   . Anxiety     Past Surgical History  Procedure Date  . Colon surgery 08/2010    colon cancer  . Endoscopic insertion peritoneal catheter port     placement  . Esophagogastroduodenoscopy     ulcers  . Colonoscopy 08/17/2011    Procedure: COLONOSCOPY;  Surgeon: Petra Kuba, MD;  Location: [facility];  Service: Endoscopy;  Laterality: N/A;    Social History:  reports that he has been smoking Cigarettes.  He has been smoking about 1 pack per day. He has never used smokeless tobacco. He reports that he drinks about 16.8 ounces of alcohol per week. He reports that he uses illicit drugs (Marijuana).  Family History  Problem Relation Age of Onset  . Anesthesia problems Neg Hx   . Hypotension Neg Hx   . Malignant hyperthermia Neg Hx   . Pseudochol deficiency Neg Hx     Review of Systems:  Constitutional: Denies fever, chills, diaphoresis, appetite change and fatigue.  HEENT: Denies photophobia, eye pain, redness, hearing loss, ear pain, congestion, sore throat, rhinorrhea, sneezing, mouth sores, trouble swallowing, neck pain, neck stiffness and tinnitus.   Respiratory: Denies SOB, DOE, cough, chest tightness,  and wheezing.   Cardiovascular: Denies chest pain, palpitations and leg swelling.  Gastrointestinal: per HPI  Genitourinary: Denies dysuria, urgency, frequency, hematuria, flank pain and difficulty urinating.  Musculoskeletal: Denies myalgias, back pain, joint swelling, arthralgias and gait problem.  Skin: Denies pallor, rash and wound.  Neurological: Denies dizziness, seizures, syncope, weakness, light-headedness, numbness and headaches.  Hematological: Denies adenopathy. Easy bruising, personal or family bleeding history  Psychiatric/Behavioral: Denies suicidal ideation, mood changes, confusion, nervousness, sleep disturbance and agitation   Physical Exam:  Filed Vitals:   01/23/12 1526 01/23/12 1716 01/23/12 1931  BP: 131/75  127/85  Pulse: 89  91  Temp:  98.3 F (36.8 C)    TempSrc: Oral    Resp: 20  20  Height: 5\' 7"  (1.702 m)    Weight: 99.338 kg (219 lb)    SpO2: 97% 97% 97%    Constitutional: Vital signs reviewed.  Patient is in no acute distress and cooperative with exam. Alert and oriented x3.  Head: Normocephalic and atraumatic Ear: TM normal bilaterally Mouth: no erythema or exudates, MMM Eyes: PERRL, EOMI, conjunctivae normal, No scleral icterus.  Neck: Supple, Trachea midline normal ROM, No JVD, mass, thyromegaly, or carotid bruit present.  Cardiovascular: RRR, S1 normal, S2 normal, no MRG, pulses symmetric and intact bilaterally Pulmonary/Chest: CTAB, no wheezes, rales, or rhonchi Abdominal: RUQ tenderness to deep palpation but with no rebound tenderness or guarding, non-distended, bowel sounds are normal, no masses, organomegaly, or guarding present.  GU: no CVA tenderness Musculoskeletal: No joint deformities, erythema, or stiffness, ROM full and no nontender Ext: no edema and no cyanosis, pulses palpable bilaterally (DP and PT) Hematology: no cervical, inginal, or axillary adenopathy.  Neurological: A&O x3, Strenght is normal and symmetric bilaterally, cranial nerve II-XII are grossly intact, no focal motor deficit,  sensory intact to light touch bilaterally.  Skin: Warm, dry and intact. No rash, cyanosis, or clubbing.  Psychiatric: Normal mood and affect. speech and behavior is normal. Judgment and thought content normal. Cognition and memory are normal.   Labs on Admission:  CBC     Status: Normal   Collection Time   01/23/12  4:30 PM      Component Value Range Comment   WBC 5.7  4.0 - 10.5 (K/uL)    Hemoglobin 14.0  13.0 - 17.0 (g/dL)    HCT 16.1  09.6 - 04.5 (%)    MCV 85.2  78.0 - 100.0 (fL)    Platelets 226  150 - 400 (K/uL)   COMPREHENSIVE METABOLIC PANEL     Status: Abnormal   01/23/12  4:30 PM      Component Value Range Comment   Sodium 133 (*) 135 - 145 (mEq/L)    Potassium 4.2  3.5 - 5.1 (mEq/L)     Chloride 99  96 - 112 (mEq/L)    CO2 23  19 - 32 (mEq/L)    Glucose, Bld 107 (*) 70 - 99 (mg/dL)    BUN 16  6 - 23 (mg/dL)    Creatinine, Ser 4.09  0.50 - 1.35 (mg/dL)    Calcium 9.5  8.4 - 10.5 (mg/dL)    Total Protein 7.7  6.0 - 8.3 (g/dL)    Albumin 4.0  3.5 - 5.2 (g/dL)    AST 811 (*) 0 - 37 (U/L)    ALT 152 (*) 0 - 53 (U/L)    Alkaline Phosphatase 110  39 - 117 (U/L)    Total Bilirubin 0.4  0.3 - 1.2 (mg/dL)    GFR calc non Af Amer 72 (*) >90 (mL/min)    GFR calc Af Amer 83 (*) >90 (mL/min)   LIPASE, BLOOD     Status: Normal   01/23/12  4:30 PM   Lipase 28  11 - 59 (U/L)   URINALYSIS, ROUTINE W REFLEX MICROSCOPIC     Status: Normal   Collection Time   01/23/12  6:05 PM      Component Value Range Comment   Color, Urine YELLOW  YELLOW     APPearance CLEAR  CLEAR     Specific Gravity, Urine 1.019  1.005 - 1.030     pH 5.5  5.0 - 8.0     Glucose, UA NEGATIVE  NEGATIVE (mg/dL)    Hgb urine dipstick NEGATIVE  NEGATIVE     Bilirubin Urine NEGATIVE  NEGATIVE     Ketones, ur NEGATIVE  NEGATIVE (mg/dL)    Protein, ur NEGATIVE  NEGATIVE (mg/dL)    Urobilinogen, UA 0.2  0.0 - 1.0 (mg/dL)    Nitrite NEGATIVE  NEGATIVE     Leukocytes, UA NEGATIVE  NEGATIVE      Radiological Exams on Admission: No results found.  Time Spent on Admission: Over 30 minutes  Ashyra Cantin 01/23/2012, 10:05 PM  [facility] Pager # [phone] Main Office # [phone]

## 2012-01-24 DIAGNOSIS — F411 Generalized anxiety disorder: Secondary | ICD-10-CM

## 2012-01-24 DIAGNOSIS — K529 Noninfective gastroenteritis and colitis, unspecified: Secondary | ICD-10-CM

## 2012-01-24 DIAGNOSIS — R1013 Epigastric pain: Secondary | ICD-10-CM

## 2012-01-24 DIAGNOSIS — F329 Major depressive disorder, single episode, unspecified: Secondary | ICD-10-CM

## 2012-01-24 LAB — CBC
HCT: 37.4 % — ABNORMAL LOW (ref 39.0–52.0)
Hemoglobin: 12.2 g/dL — ABNORMAL LOW (ref 13.0–17.0)
MCH: 28.3 pg (ref 26.0–34.0)
MCHC: 32.6 g/dL (ref 30.0–36.0)
MCHC: 33.2 g/dL (ref 30.0–36.0)
MCV: 86.8 fL (ref 78.0–100.0)
Platelets: 225 10*3/uL (ref 150–400)
RBC: 4.31 MIL/uL (ref 4.22–5.81)
RDW: 13.9 % (ref 11.5–15.5)
RDW: 14.1 % (ref 11.5–15.5)
WBC: 5.7 10*3/uL (ref 4.0–10.5)
WBC: 6 10*3/uL (ref 4.0–10.5)

## 2012-01-24 LAB — DIFFERENTIAL
Basophils Absolute: 0 10*3/uL (ref 0.0–0.1)
Lymphocytes Relative: 29 % (ref 12–46)
Lymphs Abs: 1.7 10*3/uL (ref 0.7–4.0)
Monocytes Absolute: 0.6 10*3/uL (ref 0.1–1.0)
Monocytes Relative: 11 % (ref 3–12)
Neutro Abs: 3.3 10*3/uL (ref 1.7–7.7)

## 2012-01-24 LAB — COMPREHENSIVE METABOLIC PANEL
ALT: 137 U/L — ABNORMAL HIGH (ref 0–53)
AST: 98 U/L — ABNORMAL HIGH (ref 0–37)
Albumin: 3.6 g/dL (ref 3.5–5.2)
Alkaline Phosphatase: 100 U/L (ref 39–117)
Alkaline Phosphatase: 97 U/L (ref 39–117)
BUN: 14 mg/dL (ref 6–23)
Chloride: 98 mEq/L (ref 96–112)
GFR calc Af Amer: 82 mL/min — ABNORMAL LOW (ref >90)
Glucose, Bld: 135 mg/dL — ABNORMAL HIGH (ref 70–99)
Potassium: 3.8 mEq/L (ref 3.5–5.1)
Potassium: 4.2 mEq/L (ref 3.5–5.1)
Sodium: 134 mEq/L — ABNORMAL LOW (ref 135–145)
Total Bilirubin: 0.4 mg/dL (ref 0.3–1.2)
Total Protein: 7 g/dL (ref 6.0–8.3)

## 2012-01-24 LAB — MAGNESIUM: Magnesium: 2.1 mg/dL (ref 1.5–2.5)

## 2012-01-24 LAB — GLUCOSE, CAPILLARY: Glucose-Capillary: 133 mg/dL — ABNORMAL HIGH (ref 70–99)

## 2012-01-24 MED ORDER — CIPROFLOXACIN HCL 500 MG PO TABS
500.0000 mg | ORAL_TABLET | Freq: Two times a day (BID) | ORAL | Status: DC
  Administered 2012-01-24 – 2012-01-30 (×12): 500 mg via ORAL
  Filled 2012-01-24 (×14): qty 1

## 2012-01-24 MED ORDER — SODIUM CHLORIDE 0.9 % IV SOLN
INTRAVENOUS | Status: DC
  Administered 2012-01-24: 18:00:00 via INTRAVENOUS
  Administered 2012-01-25: 75 mL via INTRAVENOUS
  Administered 2012-01-26: 08:00:00 via INTRAVENOUS
  Administered 2012-01-26: 75 mL via INTRAVENOUS
  Administered 2012-01-28 – 2012-02-01 (×6): via INTRAVENOUS

## 2012-01-24 MED ORDER — METRONIDAZOLE 500 MG PO TABS
500.0000 mg | ORAL_TABLET | Freq: Three times a day (TID) | ORAL | Status: DC
  Administered 2012-01-24 – 2012-01-30 (×18): 500 mg via ORAL
  Filled 2012-01-24 (×21): qty 1

## 2012-01-24 NOTE — Progress Notes (Addendum)
Subjective: Feels better. Wants to advance diet.  Objective: Vital signs in last 24 hours: Temp:  [97.8 F (36.6 C)-98 F (36.7 C)] 97.8 F (36.6 C) (05/13 1400) Pulse Rate:  [74-91] 80  (05/13 1400) Resp:  [18-20] 18  (05/13 1400) BP: (118-137)/(70-87) 118/70 mmHg (05/13 1400) SpO2:  [91 %-97 %] 91 % (05/13 1400) Weight change:  Last BM Date: 01/23/12  Intake/Output from previous day: 05/12 0701 - 05/13 0700 In: 2900 [P.O.:1500; I.V.:1300; IV Piggyback:100] Out: 1000 [Urine:850; Emesis/NG output:150] Total I/O In: 1368.3 [P.O.:480; I.V.:888.3] Out: 500 [Urine:500]   Physical Exam: General: Alert, awake, oriented x3, in no acute distress. HEENT: No bruits, no goiter. Heart: Regular rate and rhythm, without murmurs, rubs, gallops. Lungs: Clear to auscultation bilaterally. Abdomen: Soft, nontender, nondistended, positive bowel sounds. Extremities: No clubbing cyanosis or edema with positive pedal pulses. Neuro: Grossly intact, nonfocal.    Lab Results: Basic Metabolic Panel:  Basename 01/24/12 0425 01/24/12 0013  NA 134* 134*  K 4.2 3.8  CL 98 100  CO2 23 23  GLUCOSE 135* 105*  BUN 14 15  CREATININE 1.17 1.15  CALCIUM 9.2 8.9  MG -- 2.1  PHOS -- 3.8   Liver Function Tests:  Basename 01/24/12 0425 01/24/12 0013  AST 116* 98*  ALT 146* 137*  ALKPHOS 97 100  BILITOT 0.4 0.4  PROT 6.8 7.0  ALBUMIN 3.5 3.6    Basename 01/23/12 1630  LIPASE 28  AMYLASE --   CBC:  Basename 01/24/12 0425 01/24/12 0013 01/23/12 1630  WBC 6.0 5.7 --  NEUTROABS -- 3.3 3.5  HGB 12.2* 13.0 --  HCT 37.4* 39.2 --  MCV 86.8 86.7 --  PLT 225 229 --   CBG:  Basename 01/24/12 0736  GLUCAP 133*   Thyroid Function Tests:  Basename 01/24/12 0013  TSH 5.788*  T4TOTAL --  FREET4 --  T3FREE --  THYROIDAB --   Coagulation:  Basename 01/24/12 0013  LABPROT 13.9  INR 1.05   Urinalysis:  Basename 01/23/12 1805  COLORURINE YELLOW  LABSPEC 1.019  PHURINE 5.5    GLUCOSEU NEGATIVE  HGBUR NEGATIVE  BILIRUBINUR NEGATIVE  KETONESUR NEGATIVE  PROTEINUR NEGATIVE  UROBILINOGEN 0.2  NITRITE NEGATIVE  LEUKOCYTESUR NEGATIVE    Studies/Results: US Abdomen Complete  01/23/2012  *RADIOLOGY REPORT*  Clinical Data:  Right upper quadrant abdominal pain for 5 days.  COMPLETE ABDOMINAL ULTRASOUND  Comparison:  CT abdomen 01/23/2012.  Findings:  Gallbladder:  No gallstones, gallbladder wall thickening, or pericholecystic fluid.  Common bile duct:  Nonvisualization of the distal common bile duct due to bowel gas.  Maximal diameter visualized is 4 mm, normal.  Liver:  Heterogeneous liver.  Echogenic when compared to the adjacent right kidney compatible with hepatic steatosis.  IVC:  Appears normal.  Pancreas:  Not visualized due to overlying bowel gas.  Spleen:  12 cm.  Normal echotexture.  Right Kidney:  11.5 cm. Normal echotexture.  Normal central sinus echo complex.  No calculi or hydronephrosis.  Left Kidney:  12.6 cm. Normal echotexture.  Normal central sinus echo complex.  No calculi or hydronephrosis.  Abdominal aorta:  Atherosclerotic plaque.  No aneurysm.  IMPRESSION: Negative for cholelithiasis or cholecystitis.  No dilation of the visualized common bile duct.  Fatty liver.  Original Report Authenticated By: Andreas Newport, M.D.   Ct Abdomen Pelvis W Contrast  01/23/2012  *RADIOLOGY REPORT*  Clinical Data: Abdominal pain, nausea, history of small bowel obstruction  CT ABDOMEN AND PELVIS WITH CONTRAST  Technique:  Multidetector CT imaging of the abdomen and pelvis was performed following the standard protocol during bolus administration of intravenous contrast.  Contrast: OMNIPAQUE IOHEXOL 300 MG/ML  SOLN  Comparison: 08/25/2011  Findings: Lung bases are unremarkable.  Sagittal images of the spine shows no destructive bony lesions. Mild degenerative changes lumbar spine.  Liver, spleen, pancreas and adrenal glands are unremarkable.  No calcified gallstones are  noted within gallbladder.  In axial image 42 there is a mesenteric lymph node just anterior to right kidney the inferior to duodenum measures 2.2 x 1.3 cm.  This is  pathologic by size criteria.  Metastatic disease cannot be excluded.  Further evaluation possible PET scan is recommended. Small mesenteric lymph nodes are noted.  There is a second mesenteric lymph node in the right upper mesentery axial image 38 measures 10 x 9 mm borderline enlarged.  Again noted status post right hemicolectomy.  There is mild focal thickening of the proximal colonic wall at the level of the enterocolonic anastomosis.  There is mild stranding of the surrounding fat.  This is best seen in the coronal image 35. Findings are highly suspicious for mild focal colitis.  No definite colonic mass is noted at this level.  Follow-up examination after appropriate treatment is recommended to assure resolution.  There is no small bowel or colonic obstruction.  Mild atherosclerotic calcifications of the distal abdominal aorta and the iliac arteries.  No aortic aneurysm.  The left colon and sigmoid colon are unremarkable.  Urinary bladder is unremarkable.  Enhanced kidneys are symmetrical in size.  No hydronephrosis or hydroureter.  Delayed renal images shows bilateral renal symmetrical excretion.  Bilateral visualized proximal ureter is unremarkable.  Prostate gland and seminal vesicles are unremarkable.  No destructive bony lesions are noted within pelvis.  IMPRESSION:  1.  There is again noted status post right hemicolectomy.  Mild focal thickening  of the proximal wall colonic wall in the right upper abdomen at the site of the enteric colonic anastomosis. There is mild stranding of the surrounding fat.  Findings are suspicious for mild focal colitis.  No definite colonic mass is noted at this level. 2.  There is a lymph node in the right lower quadrant mesentery axial image 38 measures 10 x 9 mm.  This is borderline enlarged may be reactive.  A  second lymph node in the right upper mesentery axial image 42 measures 2.2 x 1.3 cm.  This is pathologic by size criteria.  Metastatic disease cannot be excluded. Further evaluation possible PET scan is recommended. 3.  No small bowel or colonic obstruction. 4.  No destructive bony lesions are noted.  Original Report Authenticated By: Natasha Mead, M.D.    Medications: Scheduled Meds:   . sodium chloride   Intravenous Once  . allopurinol  300 mg Oral Daily  . budesonide-formoterol  2 puff Inhalation BID  . ciprofloxacin  500 mg Oral BID  . citalopram  40 mg Oral Daily  . docusate sodium  100 mg Oral BID  . enoxaparin (LOVENOX) injection  50 mg Subcutaneous QHS  . gabapentin  300 mg Oral TID  . lisinopril  20 mg Oral Daily   And  . hydrochlorothiazide  25 mg Oral Daily  . HYDROmorphone  1 mg Intravenous Once  . HYDROmorphone  1 mg Intravenous Once  . HYDROmorphone  1 mg Intravenous Once  . metroNIDAZOLE  500 mg Oral Q8H  .  morphine injection  4 mg Intravenous Once  . ondansetron (ZOFRAN)  IV  4 mg Intravenous Once  . pantoprazole  20 mg Oral Q1200  . DISCONTD: albuterol  2.5 mg Nebulization Q4H  . DISCONTD: ciprofloxacin  400 mg Intravenous Once  . DISCONTD: ciprofloxacin  400 mg Intravenous Q12H  . DISCONTD: docusate sodium  100 mg Oral Daily  . DISCONTD: enoxaparin  30 mg Subcutaneous Q24H  . DISCONTD: ipratropium  0.5 mg Nebulization Q4H  . DISCONTD: lisinopril-hydrochlorothiazide  1 tablet Oral Daily  . DISCONTD: metronidazole  500 mg Intravenous Once  . DISCONTD: metronidazole  500 mg Intravenous Q8H   Continuous Infusions:   . sodium chloride 100 mL/hr at 01/24/12 1053   PRN Meds:.acetaminophen, acetaminophen, albuterol, ALPRAZolam, HYDROcodone-acetaminophen, HYDROmorphone (DILAUDID) injection, iohexol, ondansetron (ZOFRAN) IV, ondansetron, DISCONTD:  morphine injection  Assessment/Plan:  Principal Problem:  *Colitis Active Problems:  Transaminitis  Asthma  HTN  (hypertension)  Anxiety and depression  Alcohol abuse   #1 focal segmental colitis: Evidenced on CT scan. Has been started on Cipro Flagyl. I would probably continue antibiotics for a total of 2 weeks. I will transition to the by mouth route today as patient is willing to try solid food. If he continues to do well on solid food then we could potentially discharge him home in the next 24 hours.  #2 transaminitis: Nonobstructive pattern. Abdominal ultrasound shows fatty liver and this is likely the explanation for his transaminitis.  #3 disposition: Potential discharge home in the morning as well as he tolerates by mouth diet and by mouth antibiotics.    LOS: 1 day   Ms [facility] Pager: [phone] 01/24/2012, 3:58 PM

## 2012-01-25 DIAGNOSIS — R1013 Epigastric pain: Secondary | ICD-10-CM

## 2012-01-25 DIAGNOSIS — F329 Major depressive disorder, single episode, unspecified: Secondary | ICD-10-CM

## 2012-01-25 DIAGNOSIS — F411 Generalized anxiety disorder: Secondary | ICD-10-CM

## 2012-01-25 DIAGNOSIS — K529 Noninfective gastroenteritis and colitis, unspecified: Secondary | ICD-10-CM

## 2012-01-25 LAB — HEPATITIS PANEL, ACUTE: Hep A IgM: NEGATIVE

## 2012-01-25 MED ORDER — POLYETHYLENE GLYCOL 3350 17 G PO PACK
17.0000 g | PACK | Freq: Every day | ORAL | Status: DC
  Administered 2012-01-25 – 2012-01-29 (×5): 17 g via ORAL
  Filled 2012-01-25 (×5): qty 1

## 2012-01-25 MED ORDER — DOCUSATE SODIUM 100 MG PO CAPS
100.0000 mg | ORAL_CAPSULE | Freq: Two times a day (BID) | ORAL | Status: DC
  Administered 2012-01-25 – 2012-02-04 (×19): 100 mg via ORAL
  Filled 2012-01-25 (×21): qty 1

## 2012-01-25 NOTE — Progress Notes (Signed)
Subjective: Has had some mild abdominal cramps. Wants to keep diet as is.  Objective: Vital signs in last 24 hours: Temp:  [97.7 F (36.5 C)-98.2 F (36.8 C)] 97.7 F (36.5 C) (05/14 0540) Pulse Rate:  [74-78] 78  (05/14 0540) Resp:  [18-20] 20  (05/14 0540) BP: (114)/(68-77) 114/77 mmHg (05/14 0540) SpO2:  [96 %-98 %] 98 % (05/14 0940) Weight:  [99.6 kg (219 lb 9.3 oz)] 99.6 kg (219 lb 9.3 oz) (05/14 0500) Weight change: 0.262 kg (9.3 oz) Last BM Date: 01/24/12  Intake/Output from previous day: 05/13 0701 - 05/14 0700 In: 3094.6 [P.O.:1200; I.V.:1894.6] Out: 1100 [Urine:1100] Total I/O In: 495 [P.O.:240; I.V.:255] Out: 400 [Urine:400]   Physical Exam: General: Alert, awake, oriented x3, in no acute distress. HEENT: No bruits, no goiter. Heart: Regular rate and rhythm, without murmurs, rubs, gallops. Lungs: Clear to auscultation bilaterally. Abdomen: Soft, nontender, nondistended, positive bowel sounds. Extremities: No clubbing cyanosis or edema with positive pedal pulses. Neuro: Grossly intact, nonfocal.    Lab Results: Basic Metabolic Panel:  Basename 01/24/12 0425 01/24/12 0013  NA 134* 134*  K 4.2 3.8  CL 98 100  CO2 23 23  GLUCOSE 135* 105*  BUN 14 15  CREATININE 1.17 1.15  CALCIUM 9.2 8.9  MG -- 2.1  PHOS -- 3.8   Liver Function Tests:  Basename 01/24/12 0425 01/24/12 0013  AST 116* 98*  ALT 146* 137*  ALKPHOS 97 100  BILITOT 0.4 0.4  PROT 6.8 7.0  ALBUMIN 3.5 3.6    Basename 01/23/12 1630  LIPASE 28  AMYLASE --   CBC:  Basename 01/24/12 0425 01/24/12 0013 01/23/12 1630  WBC 6.0 5.7 --  NEUTROABS -- 3.3 3.5  HGB 12.2* 13.0 --  HCT 37.4* 39.2 --  MCV 86.8 86.7 --  PLT 225 229 --   CBG:  Basename 01/25/12 0802 01/24/12 0736  GLUCAP 142* 133*   Thyroid Function Tests:  Basename 01/24/12 0013  TSH 5.788*  T4TOTAL --  FREET4 --  T3FREE --  THYROIDAB --   Coagulation:  Basename 01/24/12 0013  LABPROT 13.9  INR 1.05    Urinalysis:  Basename 01/23/12 1805  COLORURINE YELLOW  LABSPEC 1.019  PHURINE 5.5  GLUCOSEU NEGATIVE  HGBUR NEGATIVE  BILIRUBINUR NEGATIVE  KETONESUR NEGATIVE  PROTEINUR NEGATIVE  UROBILINOGEN 0.2  NITRITE NEGATIVE  LEUKOCYTESUR NEGATIVE    Studies/Results: US Abdomen Complete  01/23/2012  *RADIOLOGY REPORT*  Clinical Data:  Right upper quadrant abdominal pain for 5 days.  COMPLETE ABDOMINAL ULTRASOUND  Comparison:  CT abdomen 01/23/2012.  Findings:  Gallbladder:  No gallstones, gallbladder wall thickening, or pericholecystic fluid.  Common bile duct:  Nonvisualization of the distal common bile duct due to bowel gas.  Maximal diameter visualized is 4 mm, normal.  Liver:  Heterogeneous liver.  Echogenic when compared to the adjacent right kidney compatible with hepatic steatosis.  IVC:  Appears normal.  Pancreas:  Not visualized due to overlying bowel gas.  Spleen:  12 cm.  Normal echotexture.  Right Kidney:  11.5 cm. Normal echotexture.  Normal central sinus echo complex.  No calculi or hydronephrosis.  Left Kidney:  12.6 cm. Normal echotexture.  Normal central sinus echo complex.  No calculi or hydronephrosis.  Abdominal aorta:  Atherosclerotic plaque.  No aneurysm.  IMPRESSION: Negative for cholelithiasis or cholecystitis.  No dilation of the visualized common bile duct.  Fatty liver.  Original Report Authenticated By: Andreas Newport, M.D.   Ct Abdomen Pelvis W Contrast  01/23/2012  *  RADIOLOGY REPORT*  Clinical Data: Abdominal pain, nausea, history of small bowel obstruction  CT ABDOMEN AND PELVIS WITH CONTRAST  Technique:  Multidetector CT imaging of the abdomen and pelvis was performed following the standard protocol during bolus administration of intravenous contrast.  Contrast: OMNIPAQUE IOHEXOL 300 MG/ML  SOLN  Comparison: 08/25/2011  Findings: Lung bases are unremarkable.  Sagittal images of the spine shows no destructive bony lesions. Mild degenerative changes lumbar spine.   Liver, spleen, pancreas and adrenal glands are unremarkable.  No calcified gallstones are noted within gallbladder.  In axial image 42 there is a mesenteric lymph node just anterior to right kidney the inferior to duodenum measures 2.2 x 1.3 cm.  This is  pathologic by size criteria.  Metastatic disease cannot be excluded.  Further evaluation possible PET scan is recommended. Small mesenteric lymph nodes are noted.  There is a second mesenteric lymph node in the right upper mesentery axial image 38 measures 10 x 9 mm borderline enlarged.  Again noted status post right hemicolectomy.  There is mild focal thickening of the proximal colonic wall at the level of the enterocolonic anastomosis.  There is mild stranding of the surrounding fat.  This is best seen in the coronal image 35. Findings are highly suspicious for mild focal colitis.  No definite colonic mass is noted at this level.  Follow-up examination after appropriate treatment is recommended to assure resolution.  There is no small bowel or colonic obstruction.  Mild atherosclerotic calcifications of the distal abdominal aorta and the iliac arteries.  No aortic aneurysm.  The left colon and sigmoid colon are unremarkable.  Urinary bladder is unremarkable.  Enhanced kidneys are symmetrical in size.  No hydronephrosis or hydroureter.  Delayed renal images shows bilateral renal symmetrical excretion.  Bilateral visualized proximal ureter is unremarkable.  Prostate gland and seminal vesicles are unremarkable.  No destructive bony lesions are noted within pelvis.  IMPRESSION:  1.  There is again noted status post right hemicolectomy.  Mild focal thickening  of the proximal wall colonic wall in the right upper abdomen at the site of the enteric colonic anastomosis. There is mild stranding of the surrounding fat.  Findings are suspicious for mild focal colitis.  No definite colonic mass is noted at this level. 2.  There is a lymph node in the right lower quadrant  mesentery axial image 38 measures 10 x 9 mm.  This is borderline enlarged may be reactive.  A second lymph node in the right upper mesentery axial image 42 measures 2.2 x 1.3 cm.  This is pathologic by size criteria.  Metastatic disease cannot be excluded. Further evaluation possible PET scan is recommended. 3.  No small bowel or colonic obstruction. 4.  No destructive bony lesions are noted.  Original Report Authenticated By: Natasha Mead, M.D.    Medications: Scheduled Meds:    . allopurinol  300 mg Oral Daily  . budesonide-formoterol  2 puff Inhalation BID  . ciprofloxacin  500 mg Oral BID  . citalopram  40 mg Oral Daily  . docusate sodium  100 mg Oral BID  . enoxaparin (LOVENOX) injection  50 mg Subcutaneous QHS  . gabapentin  300 mg Oral TID  . lisinopril  20 mg Oral Daily   And  . hydrochlorothiazide  25 mg Oral Daily  . metroNIDAZOLE  500 mg Oral Q8H  . pantoprazole  20 mg Oral Q1200  . polyethylene glycol  17 g Oral Daily  . DISCONTD: docusate sodium  100 mg Oral BID   Continuous Infusions:    . sodium chloride 75 mL (01/25/12 0505)   PRN Meds:.acetaminophen, acetaminophen, albuterol, ALPRAZolam, HYDROcodone-acetaminophen, HYDROmorphone (DILAUDID) injection, ondansetron (ZOFRAN) IV, ondansetron  Assessment/Plan:  Principal Problem:  *Colitis Active Problems:  Transaminitis  Asthma  HTN (hypertension)  Anxiety and depression  Alcohol abuse   #1 focal segmental colitis: Evidenced on CT scan. Has been started on Cipro Flagyl. I would probably continue antibiotics for a total of 2 weeks. He still has some mild to moderate abdominal pain. He does not want to regress on the diet. I have explained to nursing staff that we are going to use by mouth pain medications preferentially and reserved IV Dilaudid only for cases of breakthrough pain.  #2 transaminitis: Nonobstructive pattern. Abdominal ultrasound shows fatty liver and this is likely the explanation for his  transaminitis.  #3 disposition: Hopeful for discharge home in one to 2 days as long as he continues to tolerate solid diet with minimal pain.    LOS: 2 days   [facility] Pager: [phone] 01/25/2012, 4:34 PM

## 2012-01-26 DIAGNOSIS — K529 Noninfective gastroenteritis and colitis, unspecified: Secondary | ICD-10-CM

## 2012-01-26 DIAGNOSIS — I10 Essential (primary) hypertension: Secondary | ICD-10-CM

## 2012-01-26 DIAGNOSIS — R740 Nonspecific elevation of levels of transaminase and lactic acid dehydrogenase [LDH]: Secondary | ICD-10-CM

## 2012-01-26 DIAGNOSIS — D649 Anemia, unspecified: Secondary | ICD-10-CM

## 2012-01-26 LAB — COMPREHENSIVE METABOLIC PANEL
ALT: 149 U/L — ABNORMAL HIGH (ref 0–53)
Alkaline Phosphatase: 92 U/L (ref 39–117)
CO2: 27 mEq/L (ref 19–32)
Calcium: 9 mg/dL (ref 8.4–10.5)
Chloride: 100 mEq/L (ref 96–112)
GFR calc Af Amer: 81 mL/min — ABNORMAL LOW (ref >90)
GFR calc non Af Amer: 70 mL/min — ABNORMAL LOW (ref >90)
Glucose, Bld: 144 mg/dL — ABNORMAL HIGH (ref 70–99)
Sodium: 134 mEq/L — ABNORMAL LOW (ref 135–145)
Total Bilirubin: 0.2 mg/dL — ABNORMAL LOW (ref 0.3–1.2)

## 2012-01-26 LAB — CBC
Hemoglobin: 11.7 g/dL — ABNORMAL LOW (ref 13.0–17.0)
MCHC: 32.1 g/dL (ref 30.0–36.0)
Platelets: 200 10*3/uL (ref 150–400)
RDW: 14.1 % (ref 11.5–15.5)

## 2012-01-26 LAB — GLUCOSE, CAPILLARY: Glucose-Capillary: 114 mg/dL — ABNORMAL HIGH (ref 70–99)

## 2012-01-26 MED ORDER — OXYCODONE-ACETAMINOPHEN 5-325 MG PO TABS
1.0000 | ORAL_TABLET | ORAL | Status: DC | PRN
  Administered 2012-01-26 – 2012-01-29 (×13): 2 via ORAL
  Administered 2012-01-29: 1 via ORAL
  Administered 2012-01-29 (×2): 2 via ORAL
  Administered 2012-01-29: 1 via ORAL
  Administered 2012-01-29 – 2012-02-02 (×19): 2 via ORAL
  Filled 2012-01-26 (×18): qty 2
  Filled 2012-01-26: qty 1
  Filled 2012-01-26 (×15): qty 2
  Filled 2012-01-26: qty 1
  Filled 2012-01-26: qty 2

## 2012-01-26 NOTE — Progress Notes (Signed)
Subjective:   Chart reviewed. Patient indicates that although he has improved compared to that on admission, he still has significant abdominal cramps on the right side of his abdomen. No nausea vomiting. Constipation which improved after stool softeners. Had 2 BMs a slight.  Review of systems: Patient indicates that he had a colonoscopy in either December of 2012 oh January of 2013 which was said to be normal.  Objective  Vital signs in last 24 hours: Filed Vitals:   01/26/12 0610 01/26/12 0837 01/26/12 1400 01/26/12 1717  BP: 128/87  121/74   Pulse: 64  73   Temp: 97.7 F (36.5 C)  97.9 F (36.6 C)   TempSrc: Oral  Oral   Resp: 18  20   Height:      Weight: 99.6 kg (219 lb 9.3 oz)     SpO2: 97% 95% 96% 96%   Weight change: 0 kg (0 lb)  Intake/Output Summary (Last 24 hours) at 01/26/12 1732 Last data filed at 01/26/12 1516  Gross per 24 hour  Intake   2340 ml  Output    450 ml  Net   1890 ml    Physical Exam:  General Exam: Comfortable.  Respiratory System: Clear. No increased work of breathing.  Cardiovascular System: First and second heart sounds heard. Regular rate and rhythm. No JVD/murmurs.  Gastrointestinal System: Abdomen is non distended, soft and normal bowel sounds heard. Mild tenderness in the right mid and lower quadrant without any rigidity guarding or rebound. Central Nervous System: Alert and oriented. No focal neurological deficits. Extremities: Symmetrical 5 x 5 power.  Labs:  Basic Metabolic Panel:  Lab 01/26/12 1191 01/24/12 0425 01/24/12 0013  NA 134* 134* 134*  K 4.3 4.2 3.8  CL 100 98 100  CO2 27 23 23   GLUCOSE 144* 135* 105*  BUN 10 14 15   CREATININE 1.19 1.17 1.15  CALCIUM 9.0 9.2 8.9  ALB -- -- --  PHOS -- -- 3.8   Liver Function Tests:  Lab 01/26/12 0515 01/24/12 0425 01/24/12 0013  AST 92* 116* 98*  ALT 149* 146* 137*  ALKPHOS 92 97 100  BILITOT 0.2* 0.4 0.4  PROT 6.6 6.8 7.0  ALBUMIN 3.5 3.5 3.6    Lab 01/23/12 1630    LIPASE 28  AMYLASE --   No results found for this basename: AMMONIA:3 in the last 168 hours CBC:  Lab 01/26/12 0515 01/24/12 0425 01/24/12 0013 01/23/12 1630  WBC 4.7 6.0 5.7 --  NEUTROABS -- -- 3.3 3.5  HGB 11.7* 12.2* 13.0 --  HCT 36.5* 37.4* 39.2 --  MCV 87.7 86.8 86.7 85.2  PLT 200 225 229 --   Cardiac Enzymes: No results found for this basename: CKTOTAL:5,CKMB:5,CKMBINDEX:5,TROPONINI:5 in the last 168 hours CBG:  Lab 01/26/12 0745 01/25/12 0802 01/24/12 0736  GLUCAP 114* 142* 133*    Iron Studies: No results found for this basename: IRON,TIBC,TRANSFERRIN,FERRITIN in the last 72 hours Studies/Results: No results found. Medications:    . sodium chloride 75 mL/hr at 01/26/12 0811      . allopurinol  300 mg Oral Daily  . budesonide-formoterol  2 puff Inhalation BID  . ciprofloxacin  500 mg Oral BID  . citalopram  40 mg Oral Daily  . docusate sodium  100 mg Oral BID  . enoxaparin (LOVENOX) injection  50 mg Subcutaneous QHS  . gabapentin  300 mg Oral TID  . lisinopril  20 mg Oral Daily   And  . hydrochlorothiazide  25 mg Oral  Daily  . metroNIDAZOLE  500 mg Oral Q8H  . pantoprazole  20 mg Oral Q1200  . polyethylene glycol  17 g Oral Daily    I  have reviewed scheduled and prn medications.     Problem/Plan: Principal Problem:  *Colitis Active Problems:  Transaminitis  Asthma  HTN (hypertension)  Anxiety and depression  Alcohol abuse  1. Mild focal acute colitis near the right upper quadrant: Presumed infectious in origin. Continue Cipro and Flagyl. Change to oral Percocet which patient seems to have done better with in the past.  2. Mild transaminitis:? Secondary to fatty liver. Outpatient followup with GI. 3. Mild anemia: Follow CBCs. 4. Hypertension: Reasonably controlled. 5. Asthma: Stable  Disposition: Discharge home when pain is better controlled.  Micahel Omlor 01/26/2012,5:32 PM  LOS: 3 days

## 2012-01-27 ENCOUNTER — Inpatient Hospital Stay (HOSPITAL_COMMUNITY): Payer: Medicaid Other

## 2012-01-27 DIAGNOSIS — K529 Noninfective gastroenteritis and colitis, unspecified: Secondary | ICD-10-CM

## 2012-01-27 DIAGNOSIS — D649 Anemia, unspecified: Secondary | ICD-10-CM

## 2012-01-27 DIAGNOSIS — R740 Nonspecific elevation of levels of transaminase and lactic acid dehydrogenase [LDH]: Secondary | ICD-10-CM

## 2012-01-27 DIAGNOSIS — I10 Essential (primary) hypertension: Secondary | ICD-10-CM

## 2012-01-27 LAB — CBC
MCH: 28.2 pg (ref 26.0–34.0)
MCHC: 32 g/dL (ref 30.0–36.0)
MCV: 88.2 fL (ref 78.0–100.0)
Platelets: 210 10*3/uL (ref 150–400)
RDW: 14 % (ref 11.5–15.5)

## 2012-01-27 LAB — GLUCOSE, CAPILLARY: Glucose-Capillary: 113 mg/dL — ABNORMAL HIGH (ref 70–99)

## 2012-01-27 NOTE — Progress Notes (Signed)
Subjective:   Persisting right-sided intermittent abdominal pain which he says he has an episode every hour lasting anywhere from 15-30 minutes with intensity of 9/10. No vomiting. Nauseous at times. Small BMs last night after stool softeners.  Review of systems: Patient indicates that he had a colonoscopy in either December of 2012 oh January of 2013 which was said to be normal.  Objective  Vital signs in last 24 hours: Filed Vitals:   07-Feb-2012 0300 02/07/12 0557 02-07-2012 0851 2012/02/07 1400  BP:  127/80  169/76  Pulse:  86 70 76  Temp:  98.2 F (36.8 C)  97.6 F (36.4 C)  TempSrc:  Oral  Oral  Resp:  20 18 20   Height:      Weight: 99.6 kg (219 lb 9.3 oz)     SpO2:  99% 95% 96%   Weight change: 0 kg (0 lb)  Intake/Output Summary (Last 24 hours) at 02/07/2012 1844 Last data filed at 02-07-2012 1843  Gross per 24 hour  Intake   2085 ml  Output   3201 ml  Net  -1116 ml    Physical Exam:  General Exam: Comfortable.  Respiratory System: Clear. No increased work of breathing.  Cardiovascular System: First and second heart sounds heard. Regular rate and rhythm. No JVD/murmurs.  Gastrointestinal System: Abdomen is mildly distended, soft and normal bowel sounds heard. Mild tenderness in the right mid and lower quadrant without any rigidity guarding or rebound. Central Nervous System: Alert and oriented. No focal neurological deficits. Extremities: Symmetrical 5 x 5 power.  Labs:  Basic Metabolic Panel:  Lab 01/26/12 1610 01/24/12 0425 01/24/12 0013  NA 134* 134* 134*  K 4.3 4.2 3.8  CL 100 98 100  CO2 27 23 23   GLUCOSE 144* 135* 105*  BUN 10 14 15   CREATININE 1.19 1.17 1.15  CALCIUM 9.0 9.2 8.9  ALB -- -- --  PHOS -- -- 3.8   Liver Function Tests:  Lab 01/26/12 0515 01/24/12 0425 01/24/12 0013  AST 92* 116* 98*  ALT 149* 146* 137*  ALKPHOS 92 97 100  BILITOT 0.2* 0.4 0.4  PROT 6.6 6.8 7.0  ALBUMIN 3.5 3.5 3.6    Lab 01/23/12 1630  LIPASE 28  AMYLASE --   No  results found for this basename: AMMONIA:3 in the last 168 hours CBC:  Lab 07-Feb-2012 0421 01/26/12 0515 01/24/12 0425 01/24/12 0013 01/23/12 1630  WBC 4.2 4.7 6.0 -- --  NEUTROABS -- -- -- 3.3 3.5  HGB 11.7* 11.7* 12.2* -- --  HCT 36.6* 36.5* 37.4* -- --  MCV 88.2 87.7 86.8 86.7 85.2  PLT 210 200 225 -- --   Cardiac Enzymes: No results found for this basename: CKTOTAL:5,CKMB:5,CKMBINDEX:5,TROPONINI:5 in the last 168 hours CBG:  Lab 2012-02-07 0724 01/26/12 0745 01/25/12 0802 01/24/12 0736  GLUCAP 113* 114* 142* 133*    Iron Studies: No results found for this basename: IRON,TIBC,TRANSFERRIN,FERRITIN in the last 72 hours Studies/Results: Dg Abd 2 Views  2012/02/07  *RADIOLOGY REPORT*  Clinical Data: Abdominal distention.  ABDOMEN - 2 VIEW  Comparison: CT scan 01/23/2012.  Findings: The bowel gas pattern is unremarkable.  The stomach appears distended.  No findings for obstruction or perforation.  No free air.  The soft tissue shadows are maintained.  Lung bases are clear.  IMPRESSION:  1.  Moderate distention of the stomach with air and fluid. 2.  No findings for small bowel obstruction or free air.  Original Report Authenticated By: P. Loralie Champagne, M.D.  Medications:    . sodium chloride 75 mL (01/26/12 2023)      . allopurinol  300 mg Oral Daily  . budesonide-formoterol  2 puff Inhalation BID  . ciprofloxacin  500 mg Oral BID  . citalopram  40 mg Oral Daily  . docusate sodium  100 mg Oral BID  . enoxaparin (LOVENOX) injection  50 mg Subcutaneous QHS  . gabapentin  300 mg Oral TID  . lisinopril  20 mg Oral Daily   And  . hydrochlorothiazide  25 mg Oral Daily  . metroNIDAZOLE  500 mg Oral Q8H  . pantoprazole  20 mg Oral Q1200  . polyethylene glycol  17 g Oral Daily    I  have reviewed scheduled and prn medications.     Problem/Plan: Principal Problem:  *Colitis Active Problems:  Transaminitis  Asthma  HTN (hypertension)  Anxiety and depression  Alcohol  abuse  1. Mild focal acute colitis near the right upper quadrant: Presumed infectious in origin. Since patient does not seem to have made significant improvement and continues to complain of significant pain, will consult gastroenterology for further assistance.   2. Mild transaminitis:? Secondary to fatty liver. Outpatient followup with GI. 3. Mild anemia: Stable. 4. Hypertension: Reasonably controlled. 5. Asthma: Stable  Disposition: Not medically ready for discharge.  Jonael Paradiso 01/27/2012,6:44 PM  LOS: 4 days

## 2012-01-27 NOTE — Progress Notes (Signed)
Received call from Dr. Waymon Amato regarding calling Gastroenterology about any changes that might need to be made with patient's diet and route of antibiotics either PO/IV based on -x-ray results. Gastroenterology paged. Received call from on call. No changes to be made since patient is not vomiting or nauseated.

## 2012-01-27 NOTE — Consult Note (Signed)
[facility] Consult Note  Referring Provider: No ref. provider found Primary Care Physician:  Deloris Ping, MD, MD Primary Gastroenterologist:  Dr.  Chief Complaint: The patient is a 51 year old white male admitted with abdominal discomfort and cramps on the right greater than left gradually progressive with a CT scan of the abdomen suggesting some colitis near the surgical anastomosis. He had a right hemicolectomy in 2009 and followup colonoscopy by Dr. Ewing Schlein in January of this year. He has not had any blood in his stool. He has had some interruption in his bowel movements with small and frequent stools. He's had one episode of vomiting. He has not had any fevers. He was started on Cipro and Flagyl seem to improve initially but is still having persistent pain in his having difficulty advancing his diet and complaining of general abdominal distention.   Past Medical History  Diagnosis Date  . Hyperlipidemia   . Hypertension   . GERD (gastroesophageal reflux disease)   . COPD (chronic obstructive pulmonary disease)   . Cancer     colon  . Depression   . Anxiety     Past Surgical History  Procedure Date  . Colon surgery 08/2010    colon cancer  . Endoscopic insertion peritoneal catheter port     placement  . Esophagogastroduodenoscopy     ulcers  . Colonoscopy 08/17/2011    Procedure: COLONOSCOPY;  Surgeon: Petra Kuba, MD;  Location: [facility];  Service: Endoscopy;  Laterality: N/A;    Medications Prior to Admission  Medication Sig Dispense Refill  . albuterol (PROVENTIL HFA;VENTOLIN HFA) 108 (90 BASE) MCG/ACT inhaler Inhale 2 puffs into the lungs every 6 (six) hours as needed. Wheezing or shortness of breath.      . allopurinol (ZYLOPRIM) 300 MG tablet Take 300 mg by mouth daily.        Marland Kitchen ALPRAZolam (XANAX) 1 MG tablet Take 1 mg by mouth 2 (two) times daily as needed. Anxiety.      . budesonide-formoterol (SYMBICORT) 80-4.5 MCG/ACT inhaler Inhale 2 puffs  into the lungs 2 (two) times daily.        . cephALEXin (KEFLEX) 500 MG capsule Take 500 mg by mouth 3 (three) times daily.      . citalopram (CELEXA) 40 MG tablet Take 40 mg by mouth daily.        Marland Kitchen docusate sodium (COLACE) 100 MG capsule Take 100 mg by mouth daily.      Marland Kitchen gabapentin (NEURONTIN) 300 MG capsule Take 1 capsule (300 mg total) by mouth 3 (three) times daily.  90 capsule  1  . lisinopril-hydrochlorothiazide (PRINZIDE,ZESTORETIC) 20-25 MG per tablet Take 1 tablet by mouth daily.        Marland Kitchen LORazepam (ATIVAN) 1 MG tablet Take 1 mg by mouth every morning.      Marland Kitchen oxyCODONE-acetaminophen (PERCOCET) 5-325 MG per tablet Take 1 tablet by mouth 2 (two) times daily as needed. Pain.      . pantoprazole (PROTONIX) 20 MG tablet Take 20 mg by mouth daily.        Allergies: No Known Allergies  Family History  Problem Relation Age of Onset  . Anesthesia problems Neg Hx   . Hypotension Neg Hx   . Malignant hyperthermia Neg Hx   . Pseudochol deficiency Neg Hx     Social History:  reports that he has been smoking Cigarettes.  He has been smoking about 1 pack per day. He has never used smokeless tobacco. He reports that  he drinks about 16.8 ounces of alcohol per week. He reports that he uses illicit drugs (Marijuana).  Review of Systems: negative except as above   Blood pressure 127/80, pulse 70, temperature 98.2 F (36.8 C), temperature source Oral, resp. rate 18, height 5\' 7"  (1.702 m), weight 99.6 kg (219 lb 9.3 oz), SpO2 95.00%. Head: Normocephalic, without obvious abnormality, atraumatic Neck: no adenopathy, no carotid bruit, no JVD, supple, symmetrical, trachea midline and thyroid not enlarged, symmetric, no tenderness/mass/nodules Resp: clear to auscultation bilaterally Cardio: regular rate and rhythm, S1, S2 normal, no murmur, click, rub or gallop GI: Abdomen soft distended with normoactive bowel sounds no hepatosplenomegaly mass or guarding there is tenderness in the right lower  quadrant the Extremities: extremities normal, atraumatic, no cyanosis or edema  Results for orders placed during the hospital encounter of 01/23/12 (from the past 48 hour(s))  COMPREHENSIVE METABOLIC PANEL     Status: Abnormal   Collection Time   01/26/12  5:15 AM      Component Value Range Comment   Sodium 134 (*) 135 - 145 (mEq/L)    Potassium 4.3  3.5 - 5.1 (mEq/L)    Chloride 100  96 - 112 (mEq/L)    CO2 27  19 - 32 (mEq/L)    Glucose, Bld 144 (*) 70 - 99 (mg/dL)    BUN 10  6 - 23 (mg/dL)    Creatinine, Ser 1.61  0.50 - 1.35 (mg/dL)    Calcium 9.0  8.4 - 10.5 (mg/dL)    Total Protein 6.6  6.0 - 8.3 (g/dL)    Albumin 3.5  3.5 - 5.2 (g/dL)    AST 92 (*) 0 - 37 (U/L)    ALT 149 (*) 0 - 53 (U/L)    Alkaline Phosphatase 92  39 - 117 (U/L)    Total Bilirubin 0.2 (*) 0.3 - 1.2 (mg/dL)    GFR calc non Af Amer 70 (*) >90 (mL/min)    GFR calc Af Amer 81 (*) >90 (mL/min)   CBC     Status: Abnormal   Collection Time   01/26/12  5:15 AM      Component Value Range Comment   WBC 4.7  4.0 - 10.5 (K/uL)    RBC 4.16 (*) 4.22 - 5.81 (MIL/uL)    Hemoglobin 11.7 (*) 13.0 - 17.0 (g/dL)    HCT 09.6 (*) 04.5 - 52.0 (%)    MCV 87.7  78.0 - 100.0 (fL)    MCH 28.1  26.0 - 34.0 (pg)    MCHC 32.1  30.0 - 36.0 (g/dL)    RDW 40.9  81.1 - 91.4 (%)    Platelets 200  150 - 400 (K/uL)   GLUCOSE, CAPILLARY     Status: Abnormal   Collection Time   01/26/12  7:45 AM      Component Value Range Comment   Glucose-Capillary 114 (*) 70 - 99 (mg/dL)    Comment 1 Notify RN      Comment 2 Documented in Chart     CBC     Status: Abnormal   Collection Time   01/27/12  4:21 AM      Component Value Range Comment   WBC 4.2  4.0 - 10.5 (K/uL)    RBC 4.15 (*) 4.22 - 5.81 (MIL/uL)    Hemoglobin 11.7 (*) 13.0 - 17.0 (g/dL)    HCT 78.2 (*) 95.6 - 52.0 (%)    MCV 88.2  78.0 - 100.0 (fL)    MCH 28.2  26.0 - 34.0 (pg)    MCHC 32.0  30.0 - 36.0 (g/dL)    RDW 56.2  13.0 - 86.5 (%)    Platelets 210  150 - 400 (K/uL)     GLUCOSE, CAPILLARY     Status: Abnormal   Collection Time   01/27/12  7:24 AM      Component Value Range Comment   Glucose-Capillary 113 (*) 70 - 99 (mg/dL)    Comment 1 Notify RN      Comment 2 Documented in Chart      No results found.  Assessment: Abdominal pain with only etiology clue suggestion of nonspecific colitis on CT scan 4 days ago the site of his anastomosis. Plan:  Will review recent colonoscopy results, check KUB for developing obstruction or ileus and continue Cipro and Flagyl for now. Will consider repeat CT scan, repeat colonoscopy etc. as needed. Jessiah Steinhart C 01/27/2012, 1:13 PM

## 2012-01-28 ENCOUNTER — Encounter (HOSPITAL_COMMUNITY): Payer: Self-pay | Admitting: *Deleted

## 2012-01-28 ENCOUNTER — Encounter (HOSPITAL_COMMUNITY)
Admission: EM | Disposition: A | Discharge status: Home / Self Care | Payer: Self-pay | Source: Home / Self Care | Attending: Internal Medicine

## 2012-01-28 DIAGNOSIS — D649 Anemia, unspecified: Secondary | ICD-10-CM

## 2012-01-28 DIAGNOSIS — K529 Noninfective gastroenteritis and colitis, unspecified: Secondary | ICD-10-CM

## 2012-01-28 DIAGNOSIS — R1011 Right upper quadrant pain: Secondary | ICD-10-CM

## 2012-01-28 DIAGNOSIS — R740 Nonspecific elevation of levels of transaminase and lactic acid dehydrogenase [LDH]: Secondary | ICD-10-CM

## 2012-01-28 HISTORY — PX: ESOPHAGOGASTRODUODENOSCOPY: SHX5428

## 2012-01-28 LAB — COMPREHENSIVE METABOLIC PANEL
BUN: 14 mg/dL (ref 6–23)
CO2: 27 mEq/L (ref 19–32)
Calcium: 9.7 mg/dL (ref 8.4–10.5)
GFR calc Af Amer: 83 mL/min — ABNORMAL LOW (ref >90)
GFR calc non Af Amer: 72 mL/min — ABNORMAL LOW (ref >90)
Glucose, Bld: 131 mg/dL — ABNORMAL HIGH (ref 70–99)
Total Protein: 6.8 g/dL (ref 6.0–8.3)

## 2012-01-28 LAB — GLUCOSE, CAPILLARY: Glucose-Capillary: 133 mg/dL — ABNORMAL HIGH (ref 70–99)

## 2012-01-28 SURGERY — EGD (ESOPHAGOGASTRODUODENOSCOPY)
Anesthesia: Moderate Sedation

## 2012-01-28 MED ORDER — DIPHENHYDRAMINE HCL 50 MG/ML IJ SOLN
INTRAMUSCULAR | Status: AC
  Filled 2012-01-28: qty 1

## 2012-01-28 MED ORDER — MIDAZOLAM HCL 10 MG/2ML IJ SOLN
INTRAMUSCULAR | Status: DC | PRN
  Administered 2012-01-28: 2 mg via INTRAVENOUS
  Administered 2012-01-28: 1 mg via INTRAVENOUS
  Administered 2012-01-28: 2 mg via INTRAVENOUS

## 2012-01-28 MED ORDER — FENTANYL CITRATE 0.05 MG/ML IJ SOLN
INTRAMUSCULAR | Status: AC
  Filled 2012-01-28: qty 4

## 2012-01-28 MED ORDER — MIDAZOLAM HCL 10 MG/2ML IJ SOLN
INTRAMUSCULAR | Status: AC
  Filled 2012-01-28: qty 4

## 2012-01-28 MED ORDER — BUTAMBEN-TETRACAINE-BENZOCAINE 2-2-14 % EX AERO
INHALATION_SPRAY | CUTANEOUS | Status: DC | PRN
  Administered 2012-01-28: 2 via TOPICAL

## 2012-01-28 MED ORDER — FENTANYL NICU IV SYRINGE 50 MCG/ML
INJECTION | INTRAMUSCULAR | Status: DC | PRN
  Administered 2012-01-28 (×2): 25 ug via INTRAVENOUS

## 2012-01-28 MED ORDER — DIPHENHYDRAMINE HCL 50 MG/ML IJ SOLN
INTRAMUSCULAR | Status: DC | PRN
  Administered 2012-01-28: 25 mg via INTRAVENOUS

## 2012-01-28 NOTE — Progress Notes (Signed)
Dr. Madilyn Fireman notified gastric emptying scan won't be done until Monday am.

## 2012-01-28 NOTE — Progress Notes (Signed)
[facility] Progress Note  Subjective: Patient feels about the same  Objective: Vital signs in last 24 hours: Temp:  [97.6 F (36.4 C)-98.4 F (36.9 C)] 97.7 F (36.5 C) (05/17 1039) Pulse Rate:  [72-76] 72  (05/17 0655) Resp:  [10-28] 10  (05/17 1125) BP: (118-169)/(73-91) 130/82 mmHg (05/17 1125) SpO2:  [94 %-99 %] 96 % (05/17 1125) Weight:  [102.059 kg (225 lb)-102.1 kg (225 lb 1.4 oz)] 102.1 kg (225 lb 1.4 oz) (05/17 0502) Weight change: 2.459 kg (5 lb 6.8 oz)   PE: Unchanged  Lab Results: Results for orders placed during the hospital encounter of 01/23/12 (from the past 24 hour(s))  COMPREHENSIVE METABOLIC PANEL     Status: Abnormal   Collection Time   01/28/12  4:45 AM      Component Value Range   Sodium 138  135 - 145 (mEq/L)   Potassium 3.9  3.5 - 5.1 (mEq/L)   Chloride 101  96 - 112 (mEq/L)   CO2 27  19 - 32 (mEq/L)   Glucose, Bld 131 (*) 70 - 99 (mg/dL)   BUN 14  6 - 23 (mg/dL)   Creatinine, Ser 9.60  0.50 - 1.35 (mg/dL)   Calcium 9.7  8.4 - 45.4 (mg/dL)   Total Protein 6.8  6.0 - 8.3 (g/dL)   Albumin 3.6  3.5 - 5.2 (g/dL)   AST 54 (*) 0 - 37 (U/L)   ALT 103 (*) 0 - 53 (U/L)   Alkaline Phosphatase 89  39 - 117 (U/L)   Total Bilirubin 0.2 (*) 0.3 - 1.2 (mg/dL)   GFR calc non Af Amer 72 (*) >90 (mL/min)   GFR calc Af Amer 83 (*) >90 (mL/min)  GLUCOSE, CAPILLARY     Status: Abnormal   Collection Time   01/28/12  7:47 AM      Component Value Range   Glucose-Capillary 133 (*) 70 - 99 (mg/dL)    Studies/Results: Dg Abd 2 Views  Feb 25, 2012  *RADIOLOGY REPORT*  Clinical Data: Abdominal distention.  ABDOMEN - 2 VIEW  Comparison: CT scan 01/23/2012.  Findings: The bowel gas pattern is unremarkable.  The stomach appears distended.  No findings for obstruction or perforation.  No free air.  The soft tissue shadows are maintained.  Lung bases are clear.  IMPRESSION:  1.  Moderate distention of the stomach with air and fluid. 2.  No findings for small bowel  obstruction or free air.  Original Report Authenticated By: P. Loralie Champagne, M.D.    EGD: Moderate retained gastric contents, no obvious gastroduodenal outlet obstruction, suggesting delayed gastric emptying  Assessment: Unclear picture of right lower quadrant pain with possible inflammation at the site of previous colonic anastomosis but with symptoms and endoscopic picture also suggestive of delayed gastric emptying.  Plan: Will obtain nuclear medicine gastric emptying scan, consider prokinetic agent. Dr. Ewing Schlein to see tomorrow and decide whether suggested thickening at the anastomosis needs further workup.    Joe [name] C 01/28/2012, 11:35 AM

## 2012-01-28 NOTE — Op Note (Signed)
[facility] [address] [place], [place]  [address]  ENDOSCOPY PROCEDURE REPORT  PATIENT:  Joe, [name]  MR#:  [id number] BIRTHDATE:  27-Oct-1960, 51 yrs. old  GENDER:  male  ENDOSCOPIST:  Dorena Cookey Referred by:  PROCEDURE DATE:  01/28/2012 PROCEDURE: ASA CLASS: INDICATIONS:  MEDICATIONS:  Fentanyl 25 mcg, Versed 2 mg, Benadryl 25 mg TOPICAL ANESTHETIC:  DESCRIPTION OF PROCEDURE:   After the risks benefits and alternatives of the procedure were thoroughly explained, informed consent was obtained.  The Pentax Gastroscope [id number] endoscope was introduced through the mouth and advanced to the , without limitations.  The instrument was slowly withdrawn as the mucosa was fully examined. <<PROCEDUREIMAGES>>  Small to moderate amount of retained food in the proximal stomach. Very mild appearance suggestive of candidal esophagitis, slightly deformed pylorus although patent with no suggestion of obstruction, otherwise normal study  COMPLICATIONS:  None  ENDOSCOPIC IMPRESSION: Retained food suggesting delayed gastric emptying  RECOMMENDATIONS: Will obtain gastric emptying scan  REPEAT EXAM:  No  ______________________________ Dorena Cookey  CC:  n. eSIGNED:   Dorena Cookey at 01/28/2012 11:32 AM  Maximino Sarin, [phone]

## 2012-01-28 NOTE — Progress Notes (Signed)
Subjective:   Abdominal bloating and persisting right-sided abdominal pain. No BM since yesterday. No nausea or vomiting. Status post EGD earlier today.   Objective  Vital signs in last 24 hours: Filed Vitals:   01/28/12 1140 01/28/12 1150 01/28/12 1200 01/28/12 1400  BP: 128/85 111/64 104/74 127/82  Pulse:    79  Temp:    98.3 F (36.8 C)  TempSrc:    Oral  Resp: 20 15 9 15   Height:      Weight:      SpO2: 97% 94% 95% 96%   Weight change: 2.459 kg (5 lb 6.8 oz)  Intake/Output Summary (Last 24 hours) at 01/28/12 1816 Last data filed at 01/28/12 1800  Gross per 24 hour  Intake 2152.25 ml  Output   3300 ml  Net -1147.75 ml    Physical Exam:  General Exam: Comfortable. Ambulating in halls. Respiratory System: Clear. No increased work of breathing.  Cardiovascular System: First and second heart sounds heard. Regular rate and rhythm. No JVD/murmurs.  Gastrointestinal System: Abdomen is mildly distended, soft and normal bowel sounds heard. Mild tenderness in the right mid and lower quadrant without any rigidity guarding or rebound. Central Nervous System: Alert and oriented. No focal neurological deficits. Extremities: Symmetrical 5 x 5 power.  Labs:  Basic Metabolic Panel:  Lab 01/28/12 1610 01/26/12 0515 01/24/12 0425 01/24/12 0013  NA 138 134* 134* --  K 3.9 4.3 4.2 --  CL 101 100 98 --  CO2 27 27 23  --  GLUCOSE 131* 144* 135* --  BUN 14 10 14  --  CREATININE 1.16 1.19 1.17 --  CALCIUM 9.7 9.0 9.2 --  ALB -- -- -- --  PHOS -- -- -- 3.8   Liver Function Tests:  Lab 01/28/12 0445 01/26/12 0515 01/24/12 0425  AST 54* 92* 116*  ALT 103* 149* 146*  ALKPHOS 89 92 97  BILITOT 0.2* 0.2* 0.4  PROT 6.8 6.6 6.8  ALBUMIN 3.6 3.5 3.5    Lab 01/23/12 1630  LIPASE 28  AMYLASE --   No results found for this basename: AMMONIA:3 in the last 168 hours CBC:  Lab Feb 03, 2012 0421 01/26/12 0515 01/24/12 0425 01/24/12 0013 01/23/12 1630  WBC 4.2 4.7 6.0 -- --  NEUTROABS --  -- -- 3.3 3.5  HGB 11.7* 11.7* 12.2* -- --  HCT 36.6* 36.5* 37.4* -- --  MCV 88.2 87.7 86.8 86.7 85.2  PLT 210 200 225 -- --   Cardiac Enzymes: No results found for this basename: CKTOTAL:5,CKMB:5,CKMBINDEX:5,TROPONINI:5 in the last 168 hours CBG:  Lab 01/28/12 0747 03-Feb-2012 0724 01/26/12 0745 01/25/12 0802 01/24/12 0736  GLUCAP 133* 113* 114* 142* 133*    Iron Studies: No results found for this basename: IRON,TIBC,TRANSFERRIN,FERRITIN in the last 72 hours Studies/Results: Dg Abd 2 Views  02/03/2012  *RADIOLOGY REPORT*  Clinical Data: Abdominal distention.  ABDOMEN - 2 VIEW  Comparison: CT scan 01/23/2012.  Findings: The bowel gas pattern is unremarkable.  The stomach appears distended.  No findings for obstruction or perforation.  No free air.  The soft tissue shadows are maintained.  Lung bases are clear.  IMPRESSION:  1.  Moderate distention of the stomach with air and fluid. 2.  No findings for small bowel obstruction or free air.  Original Report Authenticated By: P. Loralie Champagne, M.D.   Medications:    . sodium chloride 75 mL/hr at 01/28/12 0035      . allopurinol  300 mg Oral Daily  . budesonide-formoterol  2 puff  Inhalation BID  . ciprofloxacin  500 mg Oral BID  . citalopram  40 mg Oral Daily  . docusate sodium  100 mg Oral BID  . enoxaparin (LOVENOX) injection  50 mg Subcutaneous QHS  . gabapentin  300 mg Oral TID  . lisinopril  20 mg Oral Daily   And  . hydrochlorothiazide  25 mg Oral Daily  . metroNIDAZOLE  500 mg Oral Q8H  . pantoprazole  20 mg Oral Q1200  . polyethylene glycol  17 g Oral Daily    I  have reviewed scheduled and prn medications.     Problem/Plan: Principal Problem:  *Colitis Active Problems:  Transaminitis  Asthma  HTN (hypertension)  Anxiety and depression  Alcohol abuse  1. Abdominal pain: Unclear etiology. EGD shows retained food suggesting delayed gastric emptying. GI input appreciated. Awaiting gastric emptying scan. 2. Mild  focal acute colitis near the right upper quadrant: Presumed infectious in origin. Despite antibiotics, abdominal pain did not improve and hence GI was consulted on 5/16. Await further GI recommendations. 3. Mild transaminitis:? Secondary to fatty liver. Slightly better. 4. Mild anemia: Stable. 5. Hypertension: Reasonably controlled. 6. Asthma: Stable  Disposition: Not medically ready for discharge.  Joe [name] 01/28/2012,6:16 PM  LOS: 5 days

## 2012-01-28 NOTE — Progress Notes (Signed)
UR complete 

## 2012-01-29 DIAGNOSIS — D649 Anemia, unspecified: Secondary | ICD-10-CM

## 2012-01-29 DIAGNOSIS — R740 Nonspecific elevation of levels of transaminase and lactic acid dehydrogenase [LDH]: Secondary | ICD-10-CM

## 2012-01-29 DIAGNOSIS — K529 Noninfective gastroenteritis and colitis, unspecified: Secondary | ICD-10-CM

## 2012-01-29 DIAGNOSIS — R7401 Elevation of levels of liver transaminase levels: Secondary | ICD-10-CM

## 2012-01-29 DIAGNOSIS — R1011 Right upper quadrant pain: Secondary | ICD-10-CM

## 2012-01-29 LAB — GLUCOSE, CAPILLARY: Glucose-Capillary: 137 mg/dL — ABNORMAL HIGH (ref 70–99)

## 2012-01-29 MED ORDER — DIPHENHYDRAMINE HCL 50 MG PO CAPS
50.0000 mg | ORAL_CAPSULE | Freq: Every evening | ORAL | Status: DC | PRN
  Administered 2012-01-29 – 2012-02-04 (×4): 50 mg via ORAL
  Filled 2012-01-29 (×5): qty 1

## 2012-01-29 MED ORDER — POLYETHYLENE GLYCOL 3350 17 G PO PACK: 17.0000 g | PACK | Freq: Every day | ORAL | Status: DC

## 2012-01-29 MED ORDER — POLYETHYLENE GLYCOL 3350 17 G PO PACK
17.0000 g | PACK | Freq: Three times a day (TID) | ORAL | Status: DC
  Administered 2012-01-29 – 2012-02-04 (×14): 17 g via ORAL
  Filled 2012-01-29 (×20): qty 1

## 2012-01-29 MED ORDER — DIPHENHYDRAMINE HCL 50 MG/ML IJ SOLN
25.0000 mg | Freq: Once | INTRAMUSCULAR | Status: AC
  Administered 2012-01-29: 25 mg via INTRAVENOUS
  Filled 2012-01-29: qty 1

## 2012-01-29 NOTE — Progress Notes (Signed)
Subjective:   Abdominal bloating and persisting right-sided abdominal pain. No BM or flatus for 2 days. Intermittent nausea but no vomiting.  Objective  Vital signs in last 24 hours: Filed Vitals:   01/29/12 0500 01/29/12 0557 01/29/12 0910 01/29/12 1426  BP:  129/78  125/81  Pulse:  83  75  Temp:  98.3 F (36.8 C)  98.1 F (36.7 C)  TempSrc:  Oral  Oral  Resp:  18  18  Height:      Weight: 102.7 kg (226 lb 6.6 oz)     SpO2:  97% 98% 96%   Weight change: 0.641 kg (1 lb 6.6 oz)  Intake/Output Summary (Last 24 hours) at 01/29/12 1509 Last data filed at 01/29/12 0900  Gross per 24 hour  Intake   1420 ml  Output   3350 ml  Net  -1930 ml    Physical Exam:  General Exam: Comfortable. Ambulating in halls. Respiratory System: Clear. No increased work of breathing.  Cardiovascular System: First and second heart sounds heard. Regular rate and rhythm. No JVD/murmurs.  Gastrointestinal System: Abdomen is mildly distended, soft and slightly reduced bowel sounds heard. Mild tenderness in the right mid and lower quadrant without any rigidity guarding or rebound. Central Nervous System: Alert and oriented. No focal neurological deficits. Extremities: Symmetrical 5 x 5 power.  Labs:  Basic Metabolic Panel:  Lab 01/28/12 9604 01/26/12 0515 01/24/12 0425 01/24/12 0013  NA 138 134* 134* --  K 3.9 4.3 4.2 --  CL 101 100 98 --  CO2 27 27 23  --  GLUCOSE 131* 144* 135* --  BUN 14 10 14  --  CREATININE 1.16 1.19 1.17 --  CALCIUM 9.7 9.0 9.2 --  ALB -- -- -- --  PHOS -- -- -- 3.8   Liver Function Tests:  Lab 01/28/12 0445 01/26/12 0515 01/24/12 0425  AST 54* 92* 116*  ALT 103* 149* 146*  ALKPHOS 89 92 97  BILITOT 0.2* 0.2* 0.4  PROT 6.8 6.6 6.8  ALBUMIN 3.6 3.5 3.5    Lab 01/23/12 1630  LIPASE 28  AMYLASE --   No results found for this basename: AMMONIA:3 in the last 168 hours CBC:  Lab 02-08-2012 0421 01/26/12 0515 01/24/12 0425 01/24/12 0013 01/23/12 1630  WBC 4.2 4.7  6.0 -- --  NEUTROABS -- -- -- 3.3 3.5  HGB 11.7* 11.7* 12.2* -- --  HCT 36.6* 36.5* 37.4* -- --  MCV 88.2 87.7 86.8 86.7 85.2  PLT 210 200 225 -- --   Cardiac Enzymes: No results found for this basename: CKTOTAL:5,CKMB:5,CKMBINDEX:5,TROPONINI:5 in the last 168 hours CBG:  Lab 01/29/12 0742 01/28/12 0747 02/08/2012 0724 01/26/12 0745 01/25/12 0802  GLUCAP 137* 133* 113* 114* 142*    Iron Studies: No results found for this basename: IRON,TIBC,TRANSFERRIN,FERRITIN in the last 72 hours Studies/Results: Dg Abd 2 Views  02-08-12  *RADIOLOGY REPORT*  Clinical Data: Abdominal distention.  ABDOMEN - 2 VIEW  Comparison: CT scan 01/23/2012.  Findings: The bowel gas pattern is unremarkable.  The stomach appears distended.  No findings for obstruction or perforation.  No free air.  The soft tissue shadows are maintained.  Lung bases are clear.  IMPRESSION:  1.  Moderate distention of the stomach with air and fluid. 2.  No findings for small bowel obstruction or free air.  Original Report Authenticated By: P. Loralie Champagne, M.D.   Medications:    . sodium chloride 75 mL/hr at 01/29/12 0942      . allopurinol  300 mg Oral Daily  . budesonide-formoterol  2 puff Inhalation BID  . ciprofloxacin  500 mg Oral BID  . citalopram  40 mg Oral Daily  . diphenhydrAMINE  25 mg Intravenous Once  . docusate sodium  100 mg Oral BID  . enoxaparin (LOVENOX) injection  50 mg Subcutaneous QHS  . gabapentin  300 mg Oral TID  . lisinopril  20 mg Oral Daily   And  . hydrochlorothiazide  25 mg Oral Daily  . metroNIDAZOLE  500 mg Oral Q8H  . pantoprazole  20 mg Oral Q1200  . polyethylene glycol  17 g Oral Daily  . DISCONTD: polyethylene glycol  17 g Oral Daily    I  have reviewed scheduled and prn medications.     Problem/Plan: Principal Problem:  *Colitis Active Problems:  Transaminitis  Asthma  HTN (hypertension)  Anxiety and depression  Alcohol abuse  1. Abdominal pain: Unclear etiology. EGD  5/17 shows retained food suggesting delayed gastric emptying. GI following and suspect probable partial bowel obstruction secondary to adhesions versus recurrent tumor. Changed to clear liquid diet and are considering repeat colonoscopy versus PET scan. 2. Mild focal acute colitis near the right upper quadrant: Presumed infectious in origin. Despite antibiotics, abdominal pain did not improve and hence GI was consulted on 5/16.  3. Mild transaminitis:? Secondary to fatty liver. Slightly better. 4. Mild anemia: Stable. 5. Hypertension: Reasonably controlled. 6. Asthma: Stable  Disposition: Not medically ready for discharge.  Joe [name] 01/29/2012,3:09 PM  LOS: 6 days

## 2012-01-29 NOTE — Progress Notes (Signed)
Joe [name] 11:40 AM  Subjective: The patient continues with his gas and bloating and abdominal pain around his incision without nausea or vomiting and not to much air from the lower bowel or bowel movements. He says this came on fairly sudden and has not been having much problems since I last saw him with his colonoscopy in December which was reviewed  Objective: Vital signs stable afebrile no acute distress abdomen is soft somewhat distended no guarding or rebound rare bowel sound no new labs or x-ray  Assessment: Probable partial bowel obstruction questionably due to adhesions narrowing of the anastomosis or recurrent tumor all of which were discussed with the patient and his wife and I doubt a gastric study will shed any light on the above situation and I'm sure it will show delayed emptying  Plan: Will put back on clear liquid and increase MiraLAX to 3 times a day and consider repeat colonoscopy versus PET scan to possibly delineate the above and I discussed his case with the hospital team as well  [facility]

## 2012-01-30 DIAGNOSIS — R1011 Right upper quadrant pain: Secondary | ICD-10-CM

## 2012-01-30 DIAGNOSIS — R740 Nonspecific elevation of levels of transaminase and lactic acid dehydrogenase [LDH]: Secondary | ICD-10-CM

## 2012-01-30 DIAGNOSIS — D649 Anemia, unspecified: Secondary | ICD-10-CM

## 2012-01-30 DIAGNOSIS — K529 Noninfective gastroenteritis and colitis, unspecified: Secondary | ICD-10-CM

## 2012-01-30 LAB — CREATININE, SERUM: GFR calc Af Amer: 84 mL/min — ABNORMAL LOW (ref >90)

## 2012-01-30 MED ORDER — PEG 3350-KCL-NA BICARB-NACL 420 G PO SOLR
4000.0000 mL | Freq: Once | ORAL | Status: AC
  Administered 2012-01-30: 4000 mL via ORAL

## 2012-01-30 NOTE — Progress Notes (Signed)
Subjective:   Says feels weak today. 2 small BMs overnight but says not passing flatus. Nausea but no vomiting. Persisting sometimes severe right sided abdominal cramps. Says since he lost his job approximately 2 months ago has been drinking 4-5 beers a couple of times per week and has started smoking.  Objective  Vital signs in last 24 hours: Filed Vitals:   01/30/12 0342 01/30/12 0350 01/30/12 0500 01/30/12 1012  BP:  133/81    Pulse:  83    Temp:  98.1 F (36.7 C)    TempSrc:  Oral    Resp:  18    Height:      Weight: 100.064 kg (220 lb 9.6 oz)  103.1 kg (227 lb 4.7 oz)   SpO2:  99%  99%   Weight change: -2.637 kg (-5 lb 13 oz)  Intake/Output Summary (Last 24 hours) at 01/30/12 1317 Last data filed at 01/30/12 0850  Gross per 24 hour  Intake   2845 ml  Output   1875 ml  Net    970 ml    Physical Exam:  General Exam: Comfortable.  Respiratory System: Clear. No increased work of breathing.  Cardiovascular System: First and second heart sounds heard. Regular rate and rhythm. No JVD/murmurs.  Gastrointestinal System: Abdomen is mildly distended, soft and slightly reduced bowel sounds heard. Mild tenderness in the right mid and upper quadrant without any rigidity guarding or rebound. Central Nervous System: Alert and oriented. No focal neurological deficits. Extremities: Symmetrical 5 x 5 power.  Labs:  Basic Metabolic Panel:  Lab 01/30/12 0430 01/28/12 0445 01/26/12 0515 01/24/12 0425 01/24/12 0013  NA -- 138 134* 134* --  K -- 3.9 4.3 4.2 --  CL -- 101 100 98 --  CO2 -- 27 27 23 --  GLUCOSE -- 131* 144* 135* --  BUN -- 14 10 14 --  CREATININE 1.15 1.16 1.19 -- --  CALCIUM -- 9.7 9.0 9.2 --  ALB -- -- -- -- --  PHOS -- -- -- -- 3.8   Liver Function Tests:  Lab 01/28/12 0445 01/26/12 0515 01/24/12 0425  AST 54* 92* 116*  ALT 103* 149* 146*  ALKPHOS 89 92 97  BILITOT 0.2* 0.2* 0.4  PROT 6.8 6.6 6.8  ALBUMIN 3.6 3.5 3.5    Lab 01/23/12 1630  LIPASE 28    AMYLASE --   No results found for this basename: AMMONIA:3 in the last 168 hours CBC:  Lab 01/27/12 0421 01/26/12 0515 01/24/12 0425 01/24/12 0013 01/23/12 1630  WBC 4.2 4.7 6.0 -- --  NEUTROABS -- -- -- 3.3 3.5  HGB 11.7* 11.7* 12.2* -- --  HCT 36.6* 36.5* 37.4* -- --  MCV 88.2 87.7 86.8 86.7 85.2  PLT 210 200 225 -- --   Cardiac Enzymes: No results found for this basename: CKTOTAL:5,CKMB:5,CKMBINDEX:5,TROPONINI:5 in the last 168 hours CBG:  Lab 01/30/12 0728 01/29/12 0742 01/28/12 0747 01/27/12 0724 01/26/12 0745  GLUCAP 114* 137* 133* 113* 114*    Iron Studies: No results found for this basename: IRON,TIBC,TRANSFERRIN,FERRITIN in the last 72 hours Studies/Results: No results found. Medications:    . sodium chloride 75 mL/hr at 01/29/12 2307      . allopurinol  300 mg Oral Daily  . budesonide-formoterol  2 puff Inhalation BID  . ciprofloxacin  500 mg Oral BID  . citalopram  40 mg Oral Daily  . diphenhydrAMINE  25 mg Intravenous Once  . docusate sodium  100 mg Oral BID  . enoxaparin (  LOVENOX) injection  50 mg Subcutaneous QHS  . gabapentin  300 mg Oral TID  . lisinopril  20 mg Oral Daily   And  . hydrochlorothiazide  25 mg Oral Daily  . metroNIDAZOLE  500 mg Oral Q8H  . pantoprazole  20 mg Oral Q1200  . polyethylene glycol  17 g Oral TID  . polyethylene glycol-electrolytes  4,000 mL Oral Once  . DISCONTD: polyethylene glycol  17 g Oral Daily    I  have reviewed scheduled and prn medications.     Problem/Plan: Principal Problem:  *Colitis Active Problems:  Transaminitis  Asthma  HTN (hypertension)  Anxiety and depression  Alcohol abuse  1. Abdominal pain: Unclear etiology. EGD 5/17 shows retained food suggesting delayed gastric emptying. GI following and suspect probable partial bowel obstruction secondary to adhesions versus recurrent tumor. Changed to clear liquid diet on 5/18 and are prepping for colonoscopy on 5/20. 2. Mild focal acute colitis  near the right upper quadrant: Presumed infectious in origin. Despite antibiotics, abdominal pain did not improve and hence GI was consulted on 5/16. Discussed with GI and agreed to discontinue antibiotics. Flagyl may be causing some of the nausea. Obviously if needed antibiotics can be resumed based on colonoscopy findings. 3. Mild transaminitis:? Secondary to fatty liver. Slightly better. 4. Mild anemia: Stable. 5. Hypertension: Reasonably controlled. 6. Asthma: Stable 7. Alcohol use and tobacco abuse: Cessation counseled.  Disposition: Not medically ready for discharge. Discussed with patient spouse at the bedside and updated care.  Nikaela Coyne 01/30/2012,1:17 PM  LOS: 7 days   

## 2012-01-30 NOTE — Progress Notes (Signed)
Joe [name] 11:50 AM  Subjective: The patient says he's about the same without any symptoms but I think he looks better  Objective: Vital signs stable afebrile abdomen is soft minimal right lower quadrant tenderness without guarding or rebound positive bowel sound decreased tympany I reviewed his previous colonoscopic pictures and his anastomosis and terminal ileum looked fine  Assessment: Persistent gas bloat abdominal pain probable partial small bowel obstruction rule out adhesions versus recurrent cancer  Plan: Will proceed with colonoscopy tomorrow and if nondiagnostic probably a PET scan next and can hold gastric emptying for now and consider a trial of Reglan and we also discussed decrease alcohol and tobacco as an outpatient and the case was discussed with his wife and primary team as well  Joe [name] E

## 2012-01-31 ENCOUNTER — Telehealth: Payer: Self-pay | Admitting: *Deleted

## 2012-01-31 ENCOUNTER — Encounter (HOSPITAL_COMMUNITY)
Admission: EM | Disposition: A | Discharge status: Home / Self Care | Payer: Self-pay | Source: Home / Self Care | Attending: Internal Medicine

## 2012-01-31 ENCOUNTER — Encounter (HOSPITAL_COMMUNITY): Payer: Self-pay | Admitting: Gastroenterology

## 2012-01-31 DIAGNOSIS — D649 Anemia, unspecified: Secondary | ICD-10-CM

## 2012-01-31 DIAGNOSIS — R740 Nonspecific elevation of levels of transaminase and lactic acid dehydrogenase [LDH]: Secondary | ICD-10-CM

## 2012-01-31 DIAGNOSIS — R1011 Right upper quadrant pain: Secondary | ICD-10-CM

## 2012-01-31 DIAGNOSIS — I10 Essential (primary) hypertension: Secondary | ICD-10-CM

## 2012-01-31 HISTORY — PX: COLONOSCOPY: SHX5424

## 2012-01-31 LAB — CBC
HCT: 36.3 % — ABNORMAL LOW (ref 39.0–52.0)
Hemoglobin: 11.9 g/dL — ABNORMAL LOW (ref 13.0–17.0)
MCH: 28.7 pg (ref 26.0–34.0)
MCHC: 32.8 g/dL (ref 30.0–36.0)
MCV: 87.7 fL (ref 78.0–100.0)

## 2012-01-31 LAB — DIFFERENTIAL
Basophils Relative: 0 % (ref 0–1)
Eosinophils Absolute: 0.3 10*3/uL (ref 0.0–0.7)
Eosinophils Relative: 4 % (ref 0–5)
Monocytes Absolute: 0.7 10*3/uL (ref 0.1–1.0)
Monocytes Relative: 10 % (ref 3–12)

## 2012-01-31 LAB — COMPREHENSIVE METABOLIC PANEL
Albumin: 3.6 g/dL (ref 3.5–5.2)
BUN: 9 mg/dL (ref 6–23)
Creatinine, Ser: 1.19 mg/dL (ref 0.50–1.35)
GFR calc Af Amer: 81 mL/min — ABNORMAL LOW (ref >90)
Total Bilirubin: 0.4 mg/dL (ref 0.3–1.2)
Total Protein: 6.6 g/dL (ref 6.0–8.3)

## 2012-01-31 LAB — LIPASE, BLOOD: Lipase: 14 U/L (ref 11–59)

## 2012-01-31 LAB — GLUCOSE, CAPILLARY: Glucose-Capillary: 119 mg/dL — ABNORMAL HIGH (ref 70–99)

## 2012-01-31 SURGERY — COLONOSCOPY
Anesthesia: Moderate Sedation

## 2012-01-31 MED ORDER — FENTANYL NICU IV SYRINGE 50 MCG/ML
INJECTION | INTRAMUSCULAR | Status: DC | PRN
  Administered 2012-01-31 (×4): 25 ug via INTRAVENOUS

## 2012-01-31 MED ORDER — MIDAZOLAM HCL 5 MG/5ML IJ SOLN
INTRAMUSCULAR | Status: DC | PRN
  Administered 2012-01-31 (×4): 2.5 mg via INTRAVENOUS

## 2012-01-31 MED ORDER — MIDAZOLAM HCL 10 MG/2ML IJ SOLN
INTRAMUSCULAR | Status: AC
  Filled 2012-01-31: qty 4

## 2012-01-31 MED ORDER — DIPHENHYDRAMINE HCL 50 MG/ML IJ SOLN
INTRAMUSCULAR | Status: DC | PRN
  Administered 2012-01-31: 25 mg via INTRAVENOUS

## 2012-01-31 MED ORDER — DIPHENHYDRAMINE HCL 50 MG/ML IJ SOLN
INTRAMUSCULAR | Status: AC
  Filled 2012-01-31: qty 1

## 2012-01-31 MED ORDER — FENTANYL CITRATE 0.05 MG/ML IJ SOLN
INTRAMUSCULAR | Status: AC
  Filled 2012-01-31: qty 4

## 2012-01-31 NOTE — Telephone Encounter (Signed)
Having procedure at hospital today and asking if Dr. Truett Perna wants any lab work done while he is there? Call him on cell phone if Dr. Truett Perna needs lab work. No labs are needed at this time.

## 2012-01-31 NOTE — Op Note (Signed)
[facility] [address] [place], [place]  [address]  COLONOSCOPY PROCEDURE REPORT  PATIENT:  Joe, [name]  MR#:  [id number] BIRTHDATE:  12-21-1960, 51 yrs. old  GENDER:  male ENDOSCOPIST:  Willis Modena REF. BY:  Vida Rigger, M.D. Marcellus Scott, M.D. [facility]) PROCEDURE DATE:  01/31/2012 PROCEDURE:  Colonoscopy with biopsy ASA CLASS:  Class III INDICATIONS:  Abnormal CT of abdomen, history of colon cancer MEDICATIONS:   Benadryl 25 mg IV, Fentanyl 100 mcg IV, Versed 10 mg IV  DESCRIPTION OF PROCEDURE:   After the risks benefits and alternatives of the procedure were thoroughly explained, informed consent was obtained.  Digital rectal exam was performed and revealed no abnormalities.   The Pentax Ped Colon [id number] endoscope was introduced through the anus and advanced to the terminal ileum which was intubated for a short distance, without limitations.  The quality of the prep was fair..  The instrument was then slowly withdrawn as the colon was fully examined. <<PROCEDUREIMAGES>> FINDINGS:  Digital rectal exam was normal.  Multiple pancolonic diverticula seen.  Ileocolonic anastomosis normal. In distal neoterminal ileum, there was scattered erythema, edema and ulceration; unable to deeply intubate ileum; biopsies taken with cold forceps. Colonic mucosal normal without inflammation, vascular ectasias, polyps, or mass.  Retroflexed view of rectum normal.  ENDOSCOPIC IMPRESSION:  1.  Neoterminal inflammation, ulceration, edema; biopsies obtained.  Appearance most typical of ischemia. 2.  Pancolonic diverticulosis. 3.  Otherwise normal colonoscopy  in patient post right hemicolectomy.  RECOMMENDATIONS:    1.  Watch for potential complications of procedure. 2.  Supportive care with hydration, analgesics. 3.  Advance diet as tolerated. 4.  Await biopsy  results. 5.  If symptoms persist, might consider small bowel follow-through to assess for neoterminal ileal  luminal patency. 6.  Will follow.  ______________________________ Willis Modena  n. eSIGNEDWillis Modena at 01/31/2012 10:58 AM  Maximino Sarin, [id number]

## 2012-01-31 NOTE — Interval H&P Note (Signed)
History and Physical Interval Note:  01/31/2012 11:00 AM  Joe [name]  has presented today for surgery, with the diagnosis of abd pain, hx colon ca, abnormal catscan  The various methods of treatment have been discussed with the patient and family. After consideration of risks, benefits and other options for treatment, the patient has consented to  Procedure(s) (LRB): COLONOSCOPY (N/A) as a surgical intervention .  The patients' history has been reviewed, patient examined, no change in status, stable for surgery.  I have reviewed the patients' chart and labs.  Questions were answered to the patient's satisfaction.     Freddy Jaksch

## 2012-01-31 NOTE — Progress Notes (Signed)
Subjective:   Status post colonoscopy. Passing flatus.  Objective  Vital signs in last 24 hours: Filed Vitals:   01/31/12 1110 01/31/12 1120 01/31/12 1130 01/31/12 1400  BP: 125/82 99/52 93/68  128/78  Pulse:    82  Temp:    97.7 F (36.5 C)  TempSrc:    Oral  Resp: 13 14 19 20   Height:      Weight:      SpO2: 94% 93% 97% 97%   Weight change: -0.045 kg (-1.6 oz)  Intake/Output Summary (Last 24 hours) at 01/31/12 1840 Last data filed at 01/31/12 1800  Gross per 24 hour  Intake   2580 ml  Output    400 ml  Net   2180 ml    Physical Exam:  General Exam: Comfortable.  Respiratory System: Clear. No increased work of breathing.  Cardiovascular System: First and second heart sounds heard. Regular rate and rhythm. No JVD/murmurs.  Gastrointestinal System: Abdomen is mildly distended, soft and normal bowel sounds heard. Still mildly tender in the right upper quadrant and mid quadrant. No rigidity or guarding or rebound. Central Nervous System: Slightly somnolent post procedure but easily arousable and oriented. No focal neurological deficits. Extremities: Symmetrical 5 x 5 power.  Labs:  Basic Metabolic Panel:  Lab 01/31/12 1610 01/30/12 0430 01/28/12 0445 01/26/12 0515  NA 138 -- 138 134*  K 4.2 -- 3.9 4.3  CL 101 -- 101 100  CO2 28 -- 27 27  GLUCOSE 115* -- 131* 144*  BUN 9 -- 14 10  CREATININE 1.19 1.15 1.16 --  CALCIUM 9.6 -- 9.7 9.0  ALB -- -- -- --  PHOS -- -- -- --   Liver Function Tests:  Lab 01/31/12 0358 01/28/12 0445 01/26/12 0515  AST 54* 54* 92*  ALT 66* 103* 149*  ALKPHOS 93 89 92  BILITOT 0.4 0.2* 0.2*  PROT 6.6 6.8 6.6  ALBUMIN 3.6 3.6 3.5    Lab 01/31/12 0358  LIPASE 14  AMYLASE --   No results found for this basename: AMMONIA:3 in the last 168 hours CBC:  Lab 01/31/12 0358 01/27/12 0421 01/26/12 0515  WBC 6.8 4.2 4.7  NEUTROABS 4.7 -- --  HGB 11.9* 11.7* 11.7*  HCT 36.3* 36.6* 36.5*  MCV 87.7 88.2 87.7  PLT 188 210 200   Cardiac  Enzymes: No results found for this basename: CKTOTAL:5,CKMB:5,CKMBINDEX:5,TROPONINI:5 in the last 168 hours CBG:  Lab 01/31/12 0723 01/30/12 0728 01/29/12 0742 01/28/12 0747 01/27/12 0724  GLUCAP 119* 114* 137* 133* 113*    Iron Studies: No results found for this basename: IRON,TIBC,TRANSFERRIN,FERRITIN in the last 72 hours Studies/Results: No results found. Medications:    . sodium chloride 75 mL/hr at 01/31/12 1458      . allopurinol  300 mg Oral Daily  . budesonide-formoterol  2 puff Inhalation BID  . citalopram  40 mg Oral Daily  . docusate sodium  100 mg Oral BID  . enoxaparin (LOVENOX) injection  50 mg Subcutaneous QHS  . gabapentin  300 mg Oral TID  . lisinopril  20 mg Oral Daily   And  . hydrochlorothiazide  25 mg Oral Daily  . pantoprazole  20 mg Oral Q1200  . polyethylene glycol  17 g Oral TID  . polyethylene glycol-electrolytes  4,000 mL Oral Once    I  have reviewed scheduled and prn medications.     Problem/Plan: Principal Problem:  *Colitis Active Problems:  Transaminitis  Asthma  HTN (hypertension)  Anxiety and depression  Alcohol abuse  1. Abdominal pain: Possibly from focal neoterminal ischemic colitis per colonoscopy. Continue IV hydration and analgesics and advance diet as tolerated. GI following. If symptoms persist then for small bowel follow-through to assess neoterminal ileum.  2. Mild transaminitis:? Secondary to fatty liver/alcohol abuse. Improved. Alcohol abstinence counseled. 3. Mild anemia: Stable. 4. Hypertension: Reasonably controlled. 5. Asthma: Stable  Disposition: If patient does well clinically,? discharge home on 5/21 after GI clearance.  Chene Kasinger 01/31/2012,6:40 PM  LOS: 8 days

## 2012-01-31 NOTE — Brief Op Note (Signed)
01/23/2012 - 01/31/2012  11:18 AM

## 2012-01-31 NOTE — H&P (View-Only) (Signed)
Subjective:   Says feels weak today. 2 small BMs overnight but says not passing flatus. Nausea but no vomiting. Persisting sometimes severe right sided abdominal cramps. Says since he lost his job approximately 2 months ago has been drinking 4-5 beers a couple of times per week and has started smoking.  Objective  Vital signs in last 24 hours: Filed Vitals:   01/30/12 0342 01/30/12 0350 01/30/12 0500 01/30/12 1012  BP:  133/81    Pulse:  83    Temp:  98.1 F (36.7 C)    TempSrc:  Oral    Resp:  18    Height:      Weight: 100.064 kg (220 lb 9.6 oz)  103.1 kg (227 lb 4.7 oz)   SpO2:  99%  99%   Weight change: -2.637 kg (-5 lb 13 oz)  Intake/Output Summary (Last 24 hours) at 01/30/12 1317 Last data filed at 01/30/12 0850  Gross per 24 hour  Intake   2845 ml  Output   1875 ml  Net    970 ml    Physical Exam:  General Exam: Comfortable.  Respiratory System: Clear. No increased work of breathing.  Cardiovascular System: First and second heart sounds heard. Regular rate and rhythm. No JVD/murmurs.  Gastrointestinal System: Abdomen is mildly distended, soft and slightly reduced bowel sounds heard. Mild tenderness in the right mid and upper quadrant without any rigidity guarding or rebound. Central Nervous System: Alert and oriented. No focal neurological deficits. Extremities: Symmetrical 5 x 5 power.  Labs:  Basic Metabolic Panel:  Lab 01/30/12 1610 01/28/12 0445 01/26/12 0515 01/24/12 0425 01/24/12 0013  NA -- 138 134* 134* --  K -- 3.9 4.3 4.2 --  CL -- 101 100 98 --  CO2 -- 27 27 23  --  GLUCOSE -- 131* 144* 135* --  BUN -- 14 10 14  --  CREATININE 1.15 1.16 1.19 -- --  CALCIUM -- 9.7 9.0 9.2 --  ALB -- -- -- -- --  PHOS -- -- -- -- 3.8   Liver Function Tests:  Lab 01/28/12 0445 01/26/12 0515 01/24/12 0425  AST 54* 92* 116*  ALT 103* 149* 146*  ALKPHOS 89 92 97  BILITOT 0.2* 0.2* 0.4  PROT 6.8 6.6 6.8  ALBUMIN 3.6 3.5 3.5    Lab 01/23/12 1630  LIPASE 28    AMYLASE --   No results found for this basename: AMMONIA:3 in the last 168 hours CBC:  Lab 01/27/12 0421 01/26/12 0515 01/24/12 0425 01/24/12 0013 01/23/12 1630  WBC 4.2 4.7 6.0 -- --  NEUTROABS -- -- -- 3.3 3.5  HGB 11.7* 11.7* 12.2* -- --  HCT 36.6* 36.5* 37.4* -- --  MCV 88.2 87.7 86.8 86.7 85.2  PLT 210 200 225 -- --   Cardiac Enzymes: No results found for this basename: CKTOTAL:5,CKMB:5,CKMBINDEX:5,TROPONINI:5 in the last 168 hours CBG:  Lab 01/30/12 0728 01/29/12 0742 01/28/12 0747 01/27/12 0724 01/26/12 0745  GLUCAP 114* 137* 133* 113* 114*    Iron Studies: No results found for this basename: IRON,TIBC,TRANSFERRIN,FERRITIN in the last 72 hours Studies/Results: No results found. Medications:    . sodium chloride 75 mL/hr at 01/29/12 2307      . allopurinol  300 mg Oral Daily  . budesonide-formoterol  2 puff Inhalation BID  . ciprofloxacin  500 mg Oral BID  . citalopram  40 mg Oral Daily  . diphenhydrAMINE  25 mg Intravenous Once  . docusate sodium  100 mg Oral BID  . enoxaparin (  LOVENOX) injection  50 mg Subcutaneous QHS  . gabapentin  300 mg Oral TID  . lisinopril  20 mg Oral Daily   And  . hydrochlorothiazide  25 mg Oral Daily  . metroNIDAZOLE  500 mg Oral Q8H  . pantoprazole  20 mg Oral Q1200  . polyethylene glycol  17 g Oral TID  . polyethylene glycol-electrolytes  4,000 mL Oral Once  . DISCONTD: polyethylene glycol  17 g Oral Daily    I  have reviewed scheduled and prn medications.     Problem/Plan: Principal Problem:  *Colitis Active Problems:  Transaminitis  Asthma  HTN (hypertension)  Anxiety and depression  Alcohol abuse  1. Abdominal pain: Unclear etiology. EGD 5/17 shows retained food suggesting delayed gastric emptying. GI following and suspect probable partial bowel obstruction secondary to adhesions versus recurrent tumor. Changed to clear liquid diet on 5/18 and are prepping for colonoscopy on 5/20. 2. Mild focal acute colitis  near the right upper quadrant: Presumed infectious in origin. Despite antibiotics, abdominal pain did not improve and hence GI was consulted on 5/16. Discussed with GI and agreed to discontinue antibiotics. Flagyl may be causing some of the nausea. Obviously if needed antibiotics can be resumed based on colonoscopy findings. 3. Mild transaminitis:? Secondary to fatty liver. Slightly better. 4. Mild anemia: Stable. 5. Hypertension: Reasonably controlled. 6. Asthma: Stable 7. Alcohol use and tobacco abuse: Cessation counseled.  Disposition: Not medically ready for discharge. Discussed with patient spouse at the bedside and updated care.  Modelle Vollmer 01/30/2012,1:17 PM  LOS: 7 days

## 2012-02-01 ENCOUNTER — Encounter (HOSPITAL_COMMUNITY): Payer: Self-pay | Admitting: Gastroenterology

## 2012-02-01 DIAGNOSIS — R1011 Right upper quadrant pain: Secondary | ICD-10-CM

## 2012-02-01 DIAGNOSIS — R740 Nonspecific elevation of levels of transaminase and lactic acid dehydrogenase [LDH]: Secondary | ICD-10-CM

## 2012-02-01 DIAGNOSIS — D649 Anemia, unspecified: Secondary | ICD-10-CM

## 2012-02-01 DIAGNOSIS — R7402 Elevation of levels of lactic acid dehydrogenase (LDH): Secondary | ICD-10-CM

## 2012-02-01 DIAGNOSIS — I10 Essential (primary) hypertension: Secondary | ICD-10-CM

## 2012-02-01 LAB — GLUCOSE, CAPILLARY: Glucose-Capillary: 97 mg/dL (ref 70–99)

## 2012-02-01 MED ORDER — ENSURE COMPLETE PO LIQD
237.0000 mL | Freq: Two times a day (BID) | ORAL | Status: DC
  Administered 2012-02-02 – 2012-02-04 (×4): 237 mL via ORAL

## 2012-02-01 NOTE — Progress Notes (Signed)
Patient with stable vital signs this shift, patient is alert and oriented, pt complaining of pain to abdomen 9 out of 10 this shift and medicated with dilaudid and percocet several times this shift alternating, patient also given xanax for anxiety, pt with lose watery today, patient up and ambulating around the nursing station this shift, pt tolerating diet fairly, will continue to monitor Means, Myrtie Hawk RN 02-01-12 16:48pm

## 2012-02-01 NOTE — Progress Notes (Signed)
Interval history:  51 year old Caucasian male patient with history of colon cancer, status post right hemicolectomy and enterocolonic anastomosis, hypertension, hyperlipidemia, GERD, COPD was admitted on 5/12 with abdominal pain. CT abdomen on 5/12 suggested mild focal colitis at the anastomotic site. Patient was initially treated empirically for infectious colitis with antibiotics. Despite these measures however patient continued to have right-sided abdominal pain and bloating sensation. GI was consulted who initially performed EGD which showed some retained food, colonoscopy on 5/20 which showed findings suggestive of ischemic colitis at the neoterminal ileum. GI will follow up pathology report and if symptoms persist then they will consult surgery on 5/22.    Subjective:   Patient continues to have abdominal bloating, right-sided abdominal pain. No nausea or vomiting. Small loose stools.  Objective  Vital signs in last 24 hours: Filed Vitals:   02/01/12 0032 02/01/12 0610 02/01/12 0846 02/01/12 0900  BP:  133/80  163/74  Pulse:  84  87  Temp:  98.2 F (36.8 C)  97.9 F (36.6 C)  TempSrc:  Oral  Oral  Resp:  20  18  Height:      Weight: 99.5 kg (219 lb 5.7 oz)     SpO2:  98% 97% 98%   Weight change: -0.518 kg (-1 lb 2.3 oz)  Intake/Output Summary (Last 24 hours) at 02/01/12 1538 Last data filed at 02/01/12 1100  Gross per 24 hour  Intake   2400 ml  Output   3150 ml  Net   -750 ml    Physical Exam:  General Exam: Comfortable.  Respiratory System: Clear. No increased work of breathing.  Cardiovascular System: First and second heart sounds heard. Regular rate and rhythm. No JVD/murmurs.  Gastrointestinal System: Abdomen is mildly distended, soft and normal bowel sounds heard. Still mildly tender in the right upper quadrant and mid quadrant. No rigidity or guarding or rebound. Central Nervous System: Alert and oriented. No focal neurological deficits. Extremities: Symmetrical  5 x 5 power.  Labs:  Basic Metabolic Panel:  Lab 01/31/12 8295 01/30/12 0430 01/28/12 0445 01/26/12 0515  NA 138 -- 138 134*  K 4.2 -- 3.9 4.3  CL 101 -- 101 100  CO2 28 -- 27 27  GLUCOSE 115* -- 131* 144*  BUN 9 -- 14 10  CREATININE 1.19 1.15 1.16 --  CALCIUM 9.6 -- 9.7 9.0  ALB -- -- -- --  PHOS -- -- -- --   Liver Function Tests:  Lab 01/31/12 0358 01/28/12 0445 01/26/12 0515  AST 54* 54* 92*  ALT 66* 103* 149*  ALKPHOS 93 89 92  BILITOT 0.4 0.2* 0.2*  PROT 6.6 6.8 6.6  ALBUMIN 3.6 3.6 3.5    Lab 01/31/12 0358  LIPASE 14  AMYLASE --   No results found for this basename: AMMONIA:3 in the last 168 hours CBC:  Lab 01/31/12 0358 01/27/12 0421 01/26/12 0515  WBC 6.8 4.2 4.7  NEUTROABS 4.7 -- --  HGB 11.9* 11.7* 11.7*  HCT 36.3* 36.6* 36.5*  MCV 87.7 88.2 87.7  PLT 188 210 200   Cardiac Enzymes: No results found for this basename: CKTOTAL:5,CKMB:5,CKMBINDEX:5,TROPONINI:5 in the last 168 hours CBG:  Lab 02/01/12 0748 01/31/12 0723 01/30/12 0728 01/29/12 0742 01/28/12 0747  GLUCAP 97 119* 114* 137* 133*    Iron Studies: No results found for this basename: IRON,TIBC,TRANSFERRIN,FERRITIN in the last 72 hours Studies/Results: No results found. Medications:    . sodium chloride 75 mL/hr at 02/01/12 0451      . allopurinol  300 mg Oral Daily  . budesonide-formoterol  2 puff Inhalation BID  . citalopram  40 mg Oral Daily  . docusate sodium  100 mg Oral BID  . enoxaparin (LOVENOX) injection  50 mg Subcutaneous QHS  . gabapentin  300 mg Oral TID  . lisinopril  20 mg Oral Daily   And  . hydrochlorothiazide  25 mg Oral Daily  . pantoprazole  20 mg Oral Q1200  . polyethylene glycol  17 g Oral TID    I  have reviewed scheduled and prn medications.     Problem/Plan: Principal Problem:  *Colitis Active Problems:  Transaminitis  Asthma  HTN (hypertension)  Anxiety and depression  Alcohol abuse  1. Abdominal pain, in patient who is status post right  hemicolectomy (08/20/2010) for colon cancer and enterocolonic anastomosis: Unclear etiology. EGD (5/17) showed some retained food. Colonoscopy (5/20) suggestive of Neoterminal ischemic colitis. GI also suspecting some componant of partial obstruction at the anastomosis. Management as per GI. Discussed with Dr. Dulce Sellar. If symptoms persist and biopsies are unrevealing, for surgical consultation on 5/22. Continue IV hydration and analgesics and  full liquid diet.  2. Stage III (T3 N1) colon cancer status post right hemicolectomy 08/20/2010. He completed chemotherapy on 02/18/11. 2 mesenteric lymph nodes seen on CT abdomen this admission. Outpatient followup with oncology/Dr. Truett Perna alerted.  3. Mild transaminitis:? Secondary to fatty liver/alcohol abuse. Improved. Alcohol abstinence counseled. 4. Mild anemia: Stable. 5. Hypertension: Reasonably controlled. 6. Asthma: Stable  Disposition: not medically ready for discharge.   Uriah Trueba 02/01/2012,3:38 PM  LOS: 9 days

## 2012-02-01 NOTE — Plan of Care (Signed)
Problem: Food- and Nutrition-Related Knowledge Deficit (NB-1.1) Goal: Nutrition education Formal process to instruct or train a patient/client in a skill or to impart knowledge to help patients/clients voluntarily manage or modify food choices and eating behavior to maintain or improve health.  Outcome: Completed/Met Date Met:  02/01/12 Pt requested information for constipation and diverticulosis. Discussed foods that are high in fiber and the importance of adequate fluid intake. Provided handout of this information. Pt expressed understanding.

## 2012-02-01 NOTE — Progress Notes (Signed)
UR complete 

## 2012-02-01 NOTE — Progress Notes (Signed)
Subjective: Still having right lower quadrant pain. Still bloated with some nausea.  Is having small loose stools, infrequent.  Objective: Vital signs in last 24 hours: Temp:  [97.7 F (36.5 C)-98.2 F (36.8 C)] 98.2 F (36.8 C) (05/21 0610) Pulse Rate:  [80-84] 84  (05/21 0610) Resp:  [13-20] 20  (05/21 0610) BP: (93-133)/(52-82) 133/80 mmHg (05/21 0610) SpO2:  [93 %-98 %] 97 % (05/21 0846) Weight:  [99.5 kg (219 lb 5.7 oz)] 99.5 kg (219 lb 5.7 oz) (05/21 0032) Weight change: -0.518 kg (-1 lb 2.3 oz) Last BM Date: 01/31/12 (golytely given yest afternoon- reported that pt had bm today)  PE: GEN:  NAD ABD:  Somewhat protuberant and distended (mild-to-moderate); hypoactive bowel sounds; RLQ tenderness; no peritonitis.  Lab Results: CBC    Component Value Date/Time   WBC 6.8 01/31/2012 0358   WBC 5.3 08/25/2011 0747   RBC 4.14* 01/31/2012 0358   RBC 4.46 08/25/2011 0747   HGB 11.9* 01/31/2012 0358   HGB 13.0 08/25/2011 0747   HCT 36.3* 01/31/2012 0358   HCT 38.7 08/25/2011 0747   PLT 188 01/31/2012 0358   PLT 198 08/25/2011 0747   MCV 87.7 01/31/2012 0358   MCV 86.7 08/25/2011 0747   MCH 28.7 01/31/2012 0358   MCH 29.2 08/25/2011 0747   MCHC 32.8 01/31/2012 0358   MCHC 33.7 08/25/2011 0747   RDW 14.4 01/31/2012 0358   RDW 16.5* 08/25/2011 0747   LYMPHSABS 1.1 01/31/2012 0358   LYMPHSABS 1.5 08/25/2011 0747   MONOABS 0.7 01/31/2012 0358   MONOABS 0.5 08/25/2011 0747   EOSABS 0.3 01/31/2012 0358   EOSABS 0.2 08/25/2011 0747   BASOSABS 0.0 01/31/2012 0358   BASOSABS 0.0 08/25/2011 0747   CMP     Component Value Date/Time   NA 138 01/31/2012 0358   NA 140 08/25/2011 0747   K 4.2 01/31/2012 0358   K 4.9* 08/25/2011 0747   CL 101 01/31/2012 0358   CL 100 08/25/2011 0747   CO2 28 01/31/2012 0358   CO2 28 08/25/2011 0747   GLUCOSE 115* 01/31/2012 0358   GLUCOSE 138* 08/25/2011 0747   BUN 9 01/31/2012 0358   BUN 21 08/25/2011 0747   CREATININE 1.19 01/31/2012 0358   CREATININE  1.4* 08/25/2011 0747   CALCIUM 9.6 01/31/2012 0358   CALCIUM 9.1 08/25/2011 0747   PROT 6.6 01/31/2012 0358   PROT 7.5 08/25/2011 0747   ALBUMIN 3.6 01/31/2012 0358   AST 54* 01/31/2012 0358   AST 51* 08/25/2011 0747   ALT 66* 01/31/2012 0358   ALKPHOS 93 01/31/2012 0358   ALKPHOS 83 08/25/2011 0747   BILITOT 0.4 01/31/2012 0358   BILITOT 0.40 08/25/2011 0747   GFRNONAA 70* 01/31/2012 0358   GFRAA 81* 01/31/2012 0358   Assessment:  1.  Abnormal CT abd/pelvis (thickening and inflammation ileocolic anastomosis). 2.  Neoterminal ileal inflammation and ulceration on colonoscopy, ? Ischemic, biopsies pending. 3.  Persistent nausea, bloating, poor fecal output.  Suspect some component of partial obstruction at anastomosis.  Plan:  1.  I have discussed with radiology; based on the coronal images from CT 01/23/12, they don't feel there is much to be gained at this time, unless his symptoms have dramatically worsened, which they have not. 2.  Accordingly, continue supportive care, full liquid diet only and await biopsy results. 3.  If symptoms persist tomorrow, and biopsies are unrevealing, I will obtain surgical consult, and would defer further imaging needs to the surgeon's discretion. 4.  Will  follow.  I have discussed my recommendations at length with patient/daughter as well as with Dr. Waymon Amato, patient's hospitalist.   Joe [name] 02/01/2012, 11:06 AM

## 2012-02-02 DIAGNOSIS — R109 Unspecified abdominal pain: Secondary | ICD-10-CM

## 2012-02-02 LAB — GLUCOSE, CAPILLARY

## 2012-02-02 MED ORDER — OXYCODONE HCL 5 MG PO TABS
15.0000 mg | ORAL_TABLET | ORAL | Status: DC | PRN
  Administered 2012-02-02 – 2012-02-03 (×5): 15 mg via ORAL
  Filled 2012-02-02 (×5): qty 3

## 2012-02-02 NOTE — Progress Notes (Signed)
Subjective: RLQ pain and bloating persists  Objective: Vital signs in last 24 hours: Temp:  [97.8 F (36.6 C)-97.9 F (36.6 C)] 97.8 F (36.6 C) (05/22 0559) Pulse Rate:  [64-88] 64  (05/22 0559) Resp:  [18-20] 18  (05/22 0559) BP: (115-145)/(72-88) 115/72 mmHg (05/22 0559) SpO2:  [96 %-99 %] 96 % (05/22 0859) Weight:  [99.6 kg (219 lb 9.3 oz)] 99.6 kg (219 lb 9.3 oz) (05/22 0559) Weight change: 0.1 kg (3.5 oz) Last BM Date: 02/01/12  PE: GEN:  Cachectic, no acute distress ABD:  Soft, mild protuberant, mild right lower quadrant tenderness without peritonitis; hypoactive bowel sounds  Lab Results: CMP     Component Value Date/Time   NA 138 01/31/2012 0358   NA 140 08/25/2011 0747   K 4.2 01/31/2012 0358   K 4.9* 08/25/2011 0747   CL 101 01/31/2012 0358   CL 100 08/25/2011 0747   CO2 28 01/31/2012 0358   CO2 28 08/25/2011 0747   GLUCOSE 115* 01/31/2012 0358   GLUCOSE 138* 08/25/2011 0747   BUN 9 01/31/2012 0358   BUN 21 08/25/2011 0747   CREATININE 1.19 01/31/2012 0358   CREATININE 1.4* 08/25/2011 0747   CALCIUM 9.6 01/31/2012 0358   CALCIUM 9.1 08/25/2011 0747   PROT 6.6 01/31/2012 0358   PROT 7.5 08/25/2011 0747   ALBUMIN 3.6 01/31/2012 0358   AST 54* 01/31/2012 0358   AST 51* 08/25/2011 0747   ALT 66* 01/31/2012 0358   ALKPHOS 93 01/31/2012 0358   ALKPHOS 83 08/25/2011 0747   BILITOT 0.4 01/31/2012 0358   BILITOT 0.40 08/25/2011 0747   GFRNONAA 70* 01/31/2012 0358   GFRAA 81* 01/31/2012 0358    Assessment:  1.  RLQ pain with seemingly partial occlusion at ileocolic anastomosis. Not improving significantly with conservative measures. 2.  Neoterminal ileal ulceration.  Biopsies non-specific.  Suspect ischemia.  Plan:  1.  Continue supportive measures. 2.  Given abnormal CT scan and colonoscopic appearance of partial occlusion with ulceration at neoterminal ileum causing partial obstruction symptoms, will obtain surgical consult.  I will defer to surgical team whether  further imaging (CT/MRI angiography +/- enterography) would be of much benefit. 3.  Will follow.  Freddy Jaksch 02/02/2012, 12:49 PM

## 2012-02-02 NOTE — Consult Note (Signed)
Pt seen and examined and chart reviewed.  Colonoscopy and CT reviewed.  Anastamotic inflammatory changes probably secondary to drug,  ETOH and tobacco abuse.  Recommend stopping these activities.  Nothing to offer. He can follow up with Dr Daphine Deutscher as outpatient if necessary.

## 2012-02-02 NOTE — Progress Notes (Signed)
Subjective: RLQ pain and bloating persists   Objective: Vital signs in last 24 hours: Filed Vitals:   02/01/12 1400 02/01/12 2205 02/02/12 0559 02/02/12 0859  BP: 145/88 145/76 115/72   Pulse: 88 71 64   Temp:  97.9 F (36.6 C) 97.8 F (36.6 C)   TempSrc: Oral Oral Oral   Resp: 20 18 18    Height:      Weight:   99.6 kg (219 lb 9.3 oz)   SpO2: 97% 99% 97% 96%    Intake/Output Summary (Last 24 hours) at 02/02/12 1314 Last data filed at 02/02/12 1000  Gross per 24 hour  Intake   2520 ml  Output   3650 ml  Net  -1130 ml    Weight change: 0.1 kg (3.5 oz) General Exam: Comfortable.  Respiratory System: Clear. No increased work of breathing.  Cardiovascular System: First and second heart sounds heard. Regular rate and rhythm. No JVD/murmurs.  Gastrointestinal System: Abdomen is mildly distended, soft and normal bowel sounds heard. Still mildly tender in the right upper quadrant and mid quadrant. No rigidity or guarding or rebound.  Central Nervous System: Alert and oriented. No focal neurological deficits.  Extremities: Symmetrical 5 x 5 power.    Lab Results: Results for orders placed during the hospital encounter of 01/23/12 (from the past 24 hour(s))  GLUCOSE, CAPILLARY     Status: Abnormal   Collection Time   02/02/12  7:24 AM      Component Value Range   Glucose-Capillary 107 (*) 70 - 99 (mg/dL)   Comment 1 Notify RN     Comment 2 Documented in Chart       Micro: No results found for this or any previous visit (from the past 240 hour(s)).  Studies/Results: No results found.  Medications:  Scheduled Meds:   . allopurinol  300 mg Oral Daily  . budesonide-formoterol  2 puff Inhalation BID  . citalopram  40 mg Oral Daily  . docusate sodium  100 mg Oral BID  . enoxaparin (LOVENOX) injection  50 mg Subcutaneous QHS  . feeding supplement  237 mL Oral BID BM  . gabapentin  300 mg Oral TID  . lisinopril  20 mg Oral Daily   And  . hydrochlorothiazide  25 mg Oral  Daily  . pantoprazole  20 mg Oral Q1200  . polyethylene glycol  17 g Oral TID   Continuous Infusions:   . sodium chloride 50 mL/hr at 02/01/12 2021   PRN Meds:.acetaminophen, acetaminophen, albuterol, ALPRAZolam, diphenhydrAMINE, HYDROmorphone (DILAUDID) injection, ondansetron (ZOFRAN) IV, ondansetron, oxyCODONE-acetaminophen   Assessment: Principal Problem:  *Colitis Active Problems:  Transaminitis  Asthma  HTN (hypertension)  Anxiety and depression  Alcohol abuse   Plan: Abdominal pain, in patient who is status post right hemicolectomy (08/20/2010) for colon cancer and enterocolonic anastomosis:Neoterminal ileal ulceration 1.  Unclear etiology. EGD (5/17) showed some retained food. Colonoscopy (5/20) suggestive of Neoterminal ischemic colitis. GI also suspecting some componant of partial obstruction at the anastomosis. Management as per GI. Discussed with Dr. Dulce Sellar. Since symptoms persist and biopsies are unrevealing,  surgical consultation has already been requested. We'll defer further imaging to surgery 2.  Continue IV hydration and analgesics and full liquid diet.  3. Stage III (T3 N1) colon cancer status post right hemicolectomy 08/20/2010. He completed chemotherapy on 02/18/11. 2 mesenteric lymph nodes seen on CT abdomen this admission. Outpatient followup with oncology/Dr. Truett Perna alerted.  4. Mild transaminitis:? Secondary to fatty liver/alcohol abuse. Improved. Alcohol abstinence counseled. 5.  Mild anemia: Stable. 6. Hypertension: Reasonably controlled. 7. Asthma: Stable   LOS: 10 days   Joe [name] 02/02/2012, 1:14 PM

## 2012-02-02 NOTE — Consult Note (Signed)
Reason for Consult: Abdominal pain  Referring Physician: Susie Cassette Primary care: Dr. Pricilla Holm Oncology: Dr. Truett Perna GI: Dr. Albesa Seen is an 51 y.o. male.  HPI: Patient is a 51 year old gentleman admitted with abdominal pain and cramps on the right side which have been progressive over a 2 week he was hospitalized 01/23/2012 for this problem. Pain is intermittent, is not brought on by oral intake. He denies nausea vomiting diarrhea or constipation. He is not aware of anything that is exacerbates or relieves his pain. Since hospitalization CT scan shows prior right hemicolectomy with mild focal thickening of the proximal colonic wall. In the right upper abdomen side of the enteric colonic anastomosis. There is some mild stranding surrounding the fat. Findings were suspicious for colitis. No colonic mass was noted. There is a lymph node in the mesentery axial image 38 which was 10 x 9 mm. A second lymph node 2.2 x 1.3 cm. No small bowel obstruction or colonic obstruction was noted there was no destructive or bony lesions. He was treated with antibiotics and seen in consultation by the GI service Dr. Madilyn Fireman. He recently undergone routine colonoscopy on 08/17/2011 by Dr. Leary Roca. This was reportedly negative. After reviewing was seen in and underwent colonoscopy by Dr. Dulce Sellar. This showed neoterminal inflammation ulceration edema biopsies were obtained which showed hyperemia with nonspecific inflammation no granuloma or malignancy. The patient continues to have discomfort after colonoscopy, and we were asked to see in consultation.   Past Medical History  Diagnosis  Date  .  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 Luretha Murphy December 2011   . Hypertension   . GERD (gastroesophageal reflux disease)   . COPD (chronic obstructive pulmonary disease) ongoing tobacco use    . Cancer     colon  . Depression   . Anxiety Alcohol  use 6 pack per day  Drug use: Marijuana  Hyperlipidemia BMI of 34  Upper and lower extremity neuropathy; secondary chemotherapy      Past Surgical History  Procedure Date  . Colon surgery 08/2010    colon cancer  . Endoscopic insertion peritoneal catheter port     placement  . Esophagogastroduodenoscopy     ulcers  . Colonoscopy 08/17/2011    Procedure: COLONOSCOPY;  Surgeon: Petra Kuba, MD;  Location: [facility];  Service: Endoscopy;  Laterality: N/A;  . Esophagogastroduodenoscopy 01/28/2012    Procedure: ESOPHAGOGASTRODUODENOSCOPY (EGD);  Surgeon: Barrie Folk, MD;  Location: [facility];  Service: Endoscopy;  Laterality: N/A;  . Colonoscopy 01/31/2012    Procedure: COLONOSCOPY;  Surgeon: Willis Modena, MD;  Location: [facility];  Service: Endoscopy;  Laterality: N/A;    Family History  Problem Relation Age of Onset  . Anesthesia problems Neg Hx   . Hypotension Neg Hx   . Malignant hyperthermia Neg Hx   . Pseudochol deficiency Neg Hx     Social History:  reports that he has been smoking Cigarettes.  He has been smoking about 1 pack per day. He has never used smokeless tobacco. He reports that he drinks about 16.8 ounces of alcohol per week. He reports that he uses illicit drugs (Marijuana).  Allergies: No Known Allergies  Medications:  Prior to Admission:  Prescriptions prior to admission  Medication Sig Dispense Refill  . albuterol (PROVENTIL HFA;VENTOLIN HFA) 108 (90 BASE) MCG/ACT inhaler Inhale 2 puffs into the lungs every 6 (six) hours as needed. Wheezing or shortness of breath.      Marland Kitchen  allopurinol (ZYLOPRIM) 300 MG tablet Take 300 mg by mouth daily.        Marland Kitchen ALPRAZolam (XANAX) 1 MG tablet Take 1 mg by mouth 2 (two) times daily as needed. Anxiety.      . budesonide-formoterol (SYMBICORT) 80-4.5 MCG/ACT inhaler Inhale 2 puffs into the lungs 2 (two) times daily.        . cephALEXin (KEFLEX) 500 MG capsule Take 500 mg by mouth 3 (three) times daily.      .  citalopram (CELEXA) 40 MG tablet Take 40 mg by mouth daily.        Marland Kitchen docusate sodium (COLACE) 100 MG capsule Take 100 mg by mouth daily.      Marland Kitchen gabapentin (NEURONTIN) 300 MG capsule Take 1 capsule (300 mg total) by mouth 3 (three) times daily.  90 capsule  1  . lisinopril-hydrochlorothiazide (PRINZIDE,ZESTORETIC) 20-25 MG per tablet Take 1 tablet by mouth daily.        Marland Kitchen LORazepam (ATIVAN) 1 MG tablet Take 1 mg by mouth every morning.      Marland Kitchen oxyCODONE-acetaminophen (PERCOCET) 5-325 MG per tablet Take 1 tablet by mouth 2 (two) times daily as needed. Pain.      . pantoprazole (PROTONIX) 20 MG tablet Take 20 mg by mouth daily.       Scheduled:   . allopurinol  300 mg Oral Daily  . budesonide-formoterol  2 puff Inhalation BID  . citalopram  40 mg Oral Daily  . docusate sodium  100 mg Oral BID  . enoxaparin (LOVENOX) injection  50 mg Subcutaneous QHS  . feeding supplement  237 mL Oral BID BM  . gabapentin  300 mg Oral TID  . lisinopril  20 mg Oral Daily  . pantoprazole  20 mg Oral Q1200  . polyethylene glycol  17 g Oral TID  . DISCONTD: hydrochlorothiazide  25 mg Oral Daily   Continuous:   . sodium chloride 50 mL/hr at 02/01/12 2021   GNF:AOZHYQMVHQION, acetaminophen, albuterol, ALPRAZolam, diphenhydrAMINE, HYDROmorphone (DILAUDID) injection, ondansetron (ZOFRAN) IV, ondansetron, oxyCODONE, DISCONTD: oxyCODONE-acetaminophen Anti-infectives     Start     Dose/Rate Route Frequency Ordered Stop   01/24/12 2000   ciprofloxacin (CIPRO) tablet 500 mg  Status:  Discontinued        500 mg Oral 2 times daily 01/24/12 1558 01/30/12 1322   01/24/12 1630   metroNIDAZOLE (FLAGYL) tablet 500 mg  Status:  Discontinued        500 mg Oral 3 times per day 01/24/12 1558 01/30/12 1322   01/24/12 1000   ciprofloxacin (CIPRO) IVPB 400 mg  Status:  Discontinued        400 mg 200 mL/hr over 60 Minutes Intravenous Every 12 hours 01/23/12 2254 01/24/12 1558   01/23/12 2359   metroNIDAZOLE (FLAGYL) IVPB 500  mg  Status:  Discontinued        500 mg 100 mL/hr over 60 Minutes Intravenous Every 8 hours 01/23/12 2254 01/24/12 1558   01/23/12 2145   ciprofloxacin (CIPRO) IVPB 400 mg  Status:  Discontinued        400 mg 200 mL/hr over 60 Minutes Intravenous  Once 01/23/12 2134 01/23/12 2254   01/23/12 2145   metroNIDAZOLE (FLAGYL) IVPB 500 mg  Status:  Discontinued        500 mg 100 mL/hr over 60 Minutes Intravenous  Once 01/23/12 2134 01/23/12 2254          Results for orders placed during the hospital encounter of 01/23/12 (from  the past 48 hour(s))  GLUCOSE, CAPILLARY     Status: Normal   Collection Time   02/01/12  7:48 AM      Component Value Range Comment   Glucose-Capillary 97  70 - 99 (mg/dL)   GLUCOSE, CAPILLARY     Status: Abnormal   Collection Time   02/02/12  7:24 AM      Component Value Range Comment   Glucose-Capillary 107 (*) 70 - 99 (mg/dL)    Comment 1 Notify RN      Comment 2 Documented in Chart       No results found.  Review of Systems  Constitutional: Negative for fever, chills, weight loss, malaise/fatigue and diaphoresis.  HENT: Negative.   Eyes: Negative.   Respiratory: Positive for wheezing. Negative for cough, hemoptysis, sputum production and shortness of breath.   Cardiovascular: Positive for leg swelling. PND: occaional when up on his feet.  Gastrointestinal: Positive for heartburn (ok on protonix) and abdominal pain (pain primairly right side.). Negative for nausea, vomiting, diarrhea, constipation, blood in stool and melena.  Genitourinary: Negative.   Musculoskeletal:       Chronic hand and foot pain from neuropathy  Skin: Negative.   Neurological: Negative for weakness.       Tingling and pain upper and lower extremities  Endo/Heme/Allergies: Negative.   Psychiatric/Behavioral: Positive for depression. The patient is nervous/anxious.    Blood pressure 121/80, pulse 77, temperature 97.8 F (36.6 C), temperature source Oral, resp. rate 20, height  5\' 7"  (1.702 m), weight 99.6 kg (219 lb 9.3 oz), SpO2 97.00%. Physical Exam  Constitutional: He is oriented to person, place, and time. He appears well-developed and well-nourished. No distress.       Just back from Bathroom, +BM says pain is very bad right now.  HENT:  Head: Normocephalic and atraumatic.  Nose: Nose normal.  Eyes: Conjunctivae and EOM are normal. Pupils are equal, round, and reactive to light. Right eye exhibits no discharge. Left eye exhibits no discharge. No scleral icterus.  Neck: Normal range of motion. Neck supple. No JVD present. No tracheal deviation present. No thyromegaly present.  Cardiovascular: Normal rate, regular rhythm, normal heart sounds and intact distal pulses.  Exam reveals no gallop.   No murmur heard. Respiratory: Effort normal and breath sounds normal. No stridor. No respiratory distress. He has no wheezes. He has no rales. He exhibits no tenderness.  GI: Soft. Bowel sounds are normal. He exhibits no distension. There is tenderness (pain right side).  Musculoskeletal: Normal range of motion. He exhibits no edema and no tenderness.  Lymphadenopathy:    He has no cervical adenopathy.  Neurological: He is alert and oriented to person, place, and time. He has normal reflexes. No cranial nerve deficit.  Skin: Skin is warm and dry. No rash noted. He is not diaphoretic. No erythema.  Psychiatric: He has a normal mood and affect. His behavior is normal. Judgment and thought content normal.    Assessment/Plan: 1. Abdominal pain 2. Status post right hemicolectomy secondary to adenocarcinoma. Status post chemotherapy with post therapy neuropathy. 3. Hypertension 4. GERD/history of upper GI bleed. 5. COPD with ongoing tobacco use. 6. Polysubstance abuse including alcohol tobacco and marijuana 7. Significant anxiety and depression 8. History of 9. BMI of 34  Plan: Patient's chronic irritation most likely exacerbated by tobacco and alcohol use. Currently  there is no surgical therapy indicated. Dr. Luisa Hart recommend to discontinue tobacco and alcohol.  [facility] physician assistant for Dr. Harriette Bouillon.  Khloei Spiker 02/02/2012, 3:16 PM

## 2012-02-03 DIAGNOSIS — I10 Essential (primary) hypertension: Secondary | ICD-10-CM

## 2012-02-03 DIAGNOSIS — R740 Nonspecific elevation of levels of transaminase and lactic acid dehydrogenase [LDH]: Secondary | ICD-10-CM

## 2012-02-03 DIAGNOSIS — R1011 Right upper quadrant pain: Secondary | ICD-10-CM

## 2012-02-03 DIAGNOSIS — D649 Anemia, unspecified: Secondary | ICD-10-CM

## 2012-02-03 LAB — COMPREHENSIVE METABOLIC PANEL
ALT: 63 U/L — ABNORMAL HIGH (ref 0–53)
Albumin: 3.9 g/dL (ref 3.5–5.2)
Alkaline Phosphatase: 103 U/L (ref 39–117)
Potassium: 4.1 mEq/L (ref 3.5–5.1)
Sodium: 136 mEq/L (ref 135–145)
Total Protein: 7.1 g/dL (ref 6.0–8.3)

## 2012-02-03 LAB — CBC
MCHC: 32.6 g/dL (ref 30.0–36.0)
RDW: 14 % (ref 11.5–15.5)

## 2012-02-03 MED ORDER — OXYCODONE HCL 5 MG PO TABS
10.0000 mg | ORAL_TABLET | ORAL | Status: DC | PRN
  Administered 2012-02-03 – 2012-02-04 (×5): 10 mg via ORAL
  Filled 2012-02-03 (×6): qty 2

## 2012-02-03 MED ORDER — MORPHINE SULFATE ER 15 MG PO TBCR
15.0000 mg | EXTENDED_RELEASE_TABLET | Freq: Two times a day (BID) | ORAL | Status: DC
  Administered 2012-02-03 – 2012-02-04 (×3): 15 mg via ORAL
  Filled 2012-02-03 (×3): qty 1

## 2012-02-03 NOTE — Progress Notes (Signed)
Subjective: Abdominal pain slowly improving, but is requiring regular doses of narcotic analgesics. Is tolerating soft diet.  Objective: Vital signs in last 24 hours: Temp:  [97.8 F (36.6 C)-98.7 F (37.1 C)] 98.3 F (36.8 C) (05/23 0601) Pulse Rate:  [75-87] 87  (05/23 0601) Resp:  [18-20] 18  (05/23 0601) BP: (121-128)/(76-80) 121/79 mmHg (05/23 0601) SpO2:  [96 %-97 %] 96 % (05/23 0601) Weight:  [99.2 kg (218 lb 11.1 oz)] 99.2 kg (218 lb 11.1 oz) (05/23 0418) Weight change: -0.4 kg (-14.1 oz) Last BM Date: 02/02/12  PE: GEN:  NAD  Lab Results: CBC    Component Value Date/Time   WBC 5.1 02/03/2012 0415   WBC 5.3 08/25/2011 0747   RBC 4.13* 02/03/2012 0415   RBC 4.46 08/25/2011 0747   HGB 11.7* 02/03/2012 0415   HGB 13.0 08/25/2011 0747   HCT 35.9* 02/03/2012 0415   HCT 38.7 08/25/2011 0747   PLT 210 02/03/2012 0415   PLT 198 08/25/2011 0747   MCV 86.9 02/03/2012 0415   MCV 86.7 08/25/2011 0747   MCH 28.3 02/03/2012 0415   MCH 29.2 08/25/2011 0747   MCHC 32.6 02/03/2012 0415   MCHC 33.7 08/25/2011 0747   RDW 14.0 02/03/2012 0415   RDW 16.5* 08/25/2011 0747   LYMPHSABS 1.1 01/31/2012 0358   LYMPHSABS 1.5 08/25/2011 0747   MONOABS 0.7 01/31/2012 0358   MONOABS 0.5 08/25/2011 0747   EOSABS 0.3 01/31/2012 0358   EOSABS 0.2 08/25/2011 0747   BASOSABS 0.0 01/31/2012 0358   BASOSABS 0.0 08/25/2011 0747   Assessment:  1.  RLQ abdominal pain.  Suspect ischemia at anastomosis.  Slowly improving.  Plan:  1.  Supportive care, including judicious analgesia. 2.  Appreciate surgical consult; medical management being pursued. 3.  OK to discharge from GI standpoint; we will follow-up with [name] as outpatient. 4.  Thank you for the consultation.  We will sign-off.  Please call with any questions.   Freddy Jaksch 02/03/2012, 1:01 PM

## 2012-02-03 NOTE — Progress Notes (Addendum)
Subjective: Patient continues to endorse abdominal pain  Objective: Vital signs in last 24 hours: Filed Vitals:   02/02/12 2027 02/02/12 2057 02/03/12 0418 02/03/12 0601  BP:  128/76  121/79  Pulse:  75  87  Temp:  98.7 F (37.1 C)  98.3 F (36.8 C)  TempSrc:  Oral  Oral  Resp:  18  18  Height:      Weight:   99.2 kg (218 lb 11.1 oz)   SpO2: 96% 96%  96%    Intake/Output Summary (Last 24 hours) at 02/03/12 1028 Last data filed at 02/03/12 0700  Gross per 24 hour  Intake   2360 ml  Output   3800 ml  Net  -1440 ml    Weight change: -0.4 kg (-14.1 oz)  General Exam: Comfortable.  Respiratory System: Clear. No increased work of breathing.  Cardiovascular System: First and second heart sounds heard. Regular rate and rhythm. No JVD/murmurs.  Gastrointestinal System: Abdomen is mildly distended, soft and normal bowel sounds heard. Still mildly tender in the right upper quadrant and mid quadrant. No rigidity or guarding or rebound.  Central Nervous System: Alert and oriented. No focal neurological deficits.  Extremities: Symmetrical 5 x 5 power.   Lab Results: Results for orders placed during the hospital encounter of 01/23/12 (from the past 24 hour(s))  CBC     Status: Abnormal   Collection Time   02/03/12  4:15 AM      Component Value Range   WBC 5.1  4.0 - 10.5 (K/uL)   RBC 4.13 (*) 4.22 - 5.81 (MIL/uL)   Hemoglobin 11.7 (*) 13.0 - 17.0 (g/dL)   HCT 40.9 (*) 81.1 - 52.0 (%)   MCV 86.9  78.0 - 100.0 (fL)   MCH 28.3  26.0 - 34.0 (pg)   MCHC 32.6  30.0 - 36.0 (g/dL)   RDW 91.4  78.2 - 95.6 (%)   Platelets 210  150 - 400 (K/uL)  COMPREHENSIVE METABOLIC PANEL     Status: Abnormal   Collection Time   02/03/12  4:15 AM      Component Value Range   Sodium 136  135 - 145 (mEq/L)   Potassium 4.1  3.5 - 5.1 (mEq/L)   Chloride 100  96 - 112 (mEq/L)   CO2 26  19 - 32 (mEq/L)   Glucose, Bld 102 (*) 70 - 99 (mg/dL)   BUN 10  6 - 23 (mg/dL)   Creatinine, Ser 2.13  0.50 - 1.35  (mg/dL)   Calcium 9.3  8.4 - 08.6 (mg/dL)   Total Protein 7.1  6.0 - 8.3 (g/dL)   Albumin 3.9  3.5 - 5.2 (g/dL)   AST 72 (*) 0 - 37 (U/L)   ALT 63 (*) 0 - 53 (U/L)   Alkaline Phosphatase 103  39 - 117 (U/L)   Total Bilirubin 0.3  0.3 - 1.2 (mg/dL)   GFR calc non Af Amer 77 (*) >90 (mL/min)   GFR calc Af Amer 89 (*) >90 (mL/min)     Micro: No results found for this or any previous visit (from the past 240 hour(s)).  Studies/Results: No results found.  Medications: Scheduled Meds:   . allopurinol  300 mg Oral Daily  . budesonide-formoterol  2 puff Inhalation BID  . citalopram  40 mg Oral Daily  . docusate sodium  100 mg Oral BID  . enoxaparin (LOVENOX) injection  50 mg Subcutaneous QHS  . feeding supplement  237 mL Oral BID BM  .  gabapentin  300 mg Oral TID  . lisinopril  20 mg Oral Daily  . pantoprazole  20 mg Oral Q1200  . polyethylene glycol  17 g Oral TID  . DISCONTD: hydrochlorothiazide  25 mg Oral Daily   Continuous Infusions:   . sodium chloride 75 mL (02/02/12 1700)   PRN Meds:.acetaminophen, acetaminophen, albuterol, ALPRAZolam, diphenhydrAMINE, ondansetron (ZOFRAN) IV, ondansetron, oxyCODONE, DISCONTD:  HYDROmorphone (DILAUDID) injection, DISCONTD: oxyCODONE, DISCONTD: oxyCODONE-acetaminophen   Assessment: Principal Problem:  *Colitis Active Problems:  Transaminitis  Asthma  HTN (hypertension)  Anxiety and depression  Alcohol abuse   Plan: 1. Unclear etiology. EGD (5/17) showed some retained food. Colonoscopy (5/20) suggestive of Neoterminal ischemic colitis. GI also suspecting some componant of partial obstruction at the anastomosis. Management as per GI. Discussed with Dr. Dulce Sellar. Since symptoms persist and biopsies are unrevealing, surgical consultation was done yesterday and they do not plan to intervene. Does he need a CT angio abdomen to evaluate for mesenteric ischemia ?????? does he need a PET/CT, questionable metastatic disease on the CAT scan on  12th of May 2013. Will request surgery to clarify   2. Continue IV hydration and analgesics advanced at a mechanical soft to see the patient tolerates 3. Stage III (T3 N1) colon cancer status post right hemicolectomy 08/20/2010. He completed chemotherapy on 02/18/11. 2 mesenteric lymph nodes seen on CT abdomen this admission. Outpatient followup with oncology/Dr. Truett Perna alerted.  4. Mild transaminitis:? Secondary to fatty liver/alcohol abuse. Improved. Alcohol abstinence counseled. 5. Mild anemia: Stable. 6. Hypertension: Reasonably controlled. 7. Asthma: Stable Drug use, the patient does admit to alcohol, tobacco use, no history of drug use      LOS: 11 days   Joe [name] 02/03/2012, 10:28 AM

## 2012-02-03 NOTE — Progress Notes (Signed)
Received call from Dr. Susie Cassette asking [facility] PA to call her @ [phone]. Called Joe [name] and let him know but he had already spoken to Dr. Susie Cassette.

## 2012-02-04 DIAGNOSIS — R1011 Right upper quadrant pain: Secondary | ICD-10-CM

## 2012-02-04 DIAGNOSIS — I10 Essential (primary) hypertension: Secondary | ICD-10-CM

## 2012-02-04 DIAGNOSIS — R740 Nonspecific elevation of levels of transaminase and lactic acid dehydrogenase [LDH]: Secondary | ICD-10-CM

## 2012-02-04 DIAGNOSIS — D649 Anemia, unspecified: Secondary | ICD-10-CM

## 2012-02-04 MED ORDER — LISINOPRIL 20 MG PO TABS: 20.0000 mg | ORAL_TABLET | Freq: Every day | ORAL | Status: DC

## 2012-02-04 MED ORDER — MORPHINE SULFATE ER 15 MG PO TBCR: 15.0000 mg | EXTENDED_RELEASE_TABLET | Freq: Two times a day (BID) | ORAL | Status: DC

## 2012-02-04 MED ORDER — OXYCODONE HCL 10 MG PO TABS: 10.0000 mg | ORAL_TABLET | Freq: Three times a day (TID) | ORAL | Status: AC | PRN

## 2012-02-04 NOTE — Discharge Summary (Signed)
Physician Discharge Summary  Joe [name] MRN: [id number] DOB/AGE: [birth year] 51 y.o.  PCP: Deloris Ping, MD, MD   Admit date: 01/23/2012 Discharge date: 02/04/2012  Discharge Diagnoses:   Neoterminal Ischemic Colitis Stage III (T3 N1) colon cancer status post right hemicolectomy    Transaminitis due to fatty liver  Asthma  HTN (hypertension)  Anxiety and depression  Alcohol abuse Anemia of chronic disease History of Asthma    Medication List  As of 02/04/2012 10:51 AM   STOP taking these medications         cephALEXin 500 MG capsule      oxyCODONE-acetaminophen 5-325 MG per tablet         TAKE these medications         albuterol 108 (90 BASE) MCG/ACT inhaler   Commonly known as: PROVENTIL HFA;VENTOLIN HFA   Inhale 2 puffs into the lungs every 6 (six) hours as needed. Wheezing or shortness of breath.      allopurinol 300 MG tablet   Commonly known as: ZYLOPRIM   Take 300 mg by mouth daily.      ALPRAZolam 1 MG tablet   Commonly known as: XANAX   Take 1 mg by mouth 2 (two) times daily as needed. Anxiety.      citalopram 40 MG tablet   Commonly known as: CELEXA   Take 40 mg by mouth daily.      docusate sodium 100 MG capsule   Commonly known as: COLACE   Take 100 mg by mouth daily.      gabapentin 300 MG capsule   Commonly known as: NEURONTIN   Take 1 capsule (300 mg total) by mouth 3 (three) times daily.      lisinopril 20 MG tablet   Commonly known as: PRINIVIL,ZESTRIL   Take 1 tablet (20 mg total) by mouth daily.      lisinopril-hydrochlorothiazide 20-25 MG per tablet   Commonly known as: PRINZIDE,ZESTORETIC   Take 1 tablet by mouth daily.      LORazepam 1 MG tablet   Commonly known as: ATIVAN   Take 1 mg by mouth every morning.      morphine 15 MG 12 hr tablet   Commonly known as: MS CONTIN   Take 1 tablet (15 mg total) by mouth every 12 (twelve) hours.      Oxycodone HCl 10 MG Tabs   Take 1 tablet (10 mg total) by mouth  every 8 (eight) hours as needed.      pantoprazole 20 MG tablet   Commonly known as: PROTONIX   Take 20 mg by mouth daily.      SYMBICORT 80-4.5 MCG/ACT inhaler   Generic drug: budesonide-formoterol   Inhale 2 puffs into the lungs 2 (two) times daily.            Discharge Condition stable Disposition: 01-Home or Self Care   Consults:  #1 gastroenterology Dr. Willis Modena, [facility] gastroenterology #2 surgeryThomas A. Cornett, MD  Colonoscopy ENDOSCOPIC IMPRESSION: 1. Neoterminal inflammation, ulceration,  edema; biopsies obtained. Appearance most typical of ischemia.  2. Pancolonic diverticulosis.  3. Otherwise normal colonoscopy in patient post right  hemicolectomy.  RECOMMENDATIONS: 1. Watch for potential complications of  procedure.  2. Supportive care with hydration, analgesics.  3. Advance diet as tolerated.  4. Await biopsy results.   Endoscopy Small to moderate amount of retained food in the proximal stomach.  Very mild appearance suggestive of candidal esophagitis, slightly  deformed pylorus although patent with no suggestion  of  obstruction, otherwise normal study  COMPLICATIONS: None  ENDOSCOPIC IMPRESSION: Retained food suggesting delayed gastric  emptying \   Significant Diagnostic Studies: US Abdomen Complete  01/23/2012  *RADIOLOGY REPORT*  Clinical Data:  Right upper quadrant abdominal pain for 5 days.  COMPLETE ABDOMINAL ULTRASOUND  Comparison:  CT abdomen 01/23/2012.  Findings:  Gallbladder:  No gallstones, gallbladder wall thickening, or pericholecystic fluid.  Common bile duct:  Nonvisualization of the distal common bile duct due to bowel gas.  Maximal diameter visualized is 4 mm, normal.  Liver:  Heterogeneous liver.  Echogenic when compared to the adjacent right kidney compatible with hepatic steatosis.  IVC:  Appears normal.  Pancreas:  Not visualized due to overlying bowel gas.  Spleen:  12 cm.  Normal echotexture.  Right Kidney:  11.5 cm. Normal  echotexture.  Normal central sinus echo complex.  No calculi or hydronephrosis.  Left Kidney:  12.6 cm. Normal echotexture.  Normal central sinus echo complex.  No calculi or hydronephrosis.  Abdominal aorta:  Atherosclerotic plaque.  No aneurysm.  IMPRESSION: Negative for cholelithiasis or cholecystitis.  No dilation of the visualized common bile duct.  Fatty liver.  Original Report Authenticated By: Andreas Newport, M.D.   Ct Abdomen Pelvis W Contrast  01/23/2012  *RADIOLOGY REPORT*  Clinical Data: Abdominal pain, nausea, history of small bowel obstruction  CT ABDOMEN AND PELVIS WITH CONTRAST  Technique:  Multidetector CT imaging of the abdomen and pelvis was performed following the standard protocol during bolus administration of intravenous contrast.  Contrast: OMNIPAQUE IOHEXOL 300 MG/ML  SOLN  Comparison: 08/25/2011  Findings: Lung bases are unremarkable.  Sagittal images of the spine shows no destructive bony lesions. Mild degenerative changes lumbar spine.  Liver, spleen, pancreas and adrenal glands are unremarkable.  No calcified gallstones are noted within gallbladder.  In axial image 42 there is a mesenteric lymph node just anterior to right kidney the inferior to duodenum measures 2.2 x 1.3 cm.  This is  pathologic by size criteria.  Metastatic disease cannot be excluded.  Further evaluation possible PET scan is recommended. Small mesenteric lymph nodes are noted.  There is a second mesenteric lymph node in the right upper mesentery axial image 38 measures 10 x 9 mm borderline enlarged.  Again noted status post right hemicolectomy.  There is mild focal thickening of the proximal colonic wall at the level of the enterocolonic anastomosis.  There is mild stranding of the surrounding fat.  This is best seen in the coronal image 35. Findings are highly suspicious for mild focal colitis.  No definite colonic mass is noted at this level.  Follow-up examination after appropriate treatment is recommended  to assure resolution.  There is no small bowel or colonic obstruction.  Mild atherosclerotic calcifications of the distal abdominal aorta and the iliac arteries.  No aortic aneurysm.  The left colon and sigmoid colon are unremarkable.  Urinary bladder is unremarkable.  Enhanced kidneys are symmetrical in size.  No hydronephrosis or hydroureter.  Delayed renal images shows bilateral renal symmetrical excretion.  Bilateral visualized proximal ureter is unremarkable.  Prostate gland and seminal vesicles are unremarkable.  No destructive bony lesions are noted within pelvis.  IMPRESSION:  1.  There is again noted status post right hemicolectomy.  Mild focal thickening  of the proximal wall colonic wall in the right upper abdomen at the site of the enteric colonic anastomosis. There is mild stranding of the surrounding fat.  Findings are suspicious for mild focal colitis.  No definite colonic mass is noted at this level. 2.  There is a lymph node in the right lower quadrant mesentery axial image 38 measures 10 x 9 mm.  This is borderline enlarged may be reactive.  A second lymph node in the right upper mesentery axial image 42 measures 2.2 x 1.3 cm.  This is pathologic by size criteria.  Metastatic disease cannot be excluded. Further evaluation possible PET scan is recommended. 3.  No small bowel or colonic obstruction. 4.  No destructive bony lesions are noted.  Original Report Authenticated By: Natasha Mead, M.D.   Dg Abd 2 Views  01/27/2012  *RADIOLOGY REPORT*  Clinical Data: Abdominal distention.  ABDOMEN - 2 VIEW  Comparison: CT scan 01/23/2012.  Findings: The bowel gas pattern is unremarkable.  The stomach appears distended.  No findings for obstruction or perforation.  No free air.  The soft tissue shadows are maintained.  Lung bases are clear.  IMPRESSION:  1.  Moderate distention of the stomach with air and fluid. 2.  No findings for small bowel obstruction or free air.  Original Report Authenticated By: P. Loralie Champagne, M.D.      Microbiology: No results found for this or any previous visit (from the past 240 hour(s)).   Labs: Results for orders placed during the hospital encounter of 01/23/12 (from the past 48 hour(s))  CBC     Status: Abnormal   Collection Time   02/03/12  4:15 AM      Component Value Range Comment   WBC 5.1  4.0 - 10.5 (K/uL)    RBC 4.13 (*) 4.22 - 5.81 (MIL/uL)    Hemoglobin 11.7 (*) 13.0 - 17.0 (g/dL)    HCT 16.1 (*) 09.6 - 52.0 (%)    MCV 86.9  78.0 - 100.0 (fL)    MCH 28.3  26.0 - 34.0 (pg)    MCHC 32.6  30.0 - 36.0 (g/dL)    RDW 04.5  40.9 - 81.1 (%)    Platelets 210  150 - 400 (K/uL)   COMPREHENSIVE METABOLIC PANEL     Status: Abnormal   Collection Time   02/03/12  4:15 AM      Component Value Range Comment   Sodium 136  135 - 145 (mEq/L)    Potassium 4.1  3.5 - 5.1 (mEq/L)    Chloride 100  96 - 112 (mEq/L)    CO2 26  19 - 32 (mEq/L)    Glucose, Bld 102 (*) 70 - 99 (mg/dL)    BUN 10  6 - 23 (mg/dL)    Creatinine, Ser 9.14  0.50 - 1.35 (mg/dL)    Calcium 9.3  8.4 - 10.5 (mg/dL)    Total Protein 7.1  6.0 - 8.3 (g/dL)    Albumin 3.9  3.5 - 5.2 (g/dL)    AST 72 (*) 0 - 37 (U/L)    ALT 63 (*) 0 - 53 (U/L)    Alkaline Phosphatase 103  39 - 117 (U/L)    Total Bilirubin 0.3  0.3 - 1.2 (mg/dL)    GFR calc non Af Amer 77 (*) >90 (mL/min)    GFR calc Af Amer 89 (*) >90 (mL/min)      HPI :The patient is a 51 year old white male admitted with abdominal discomfort and cramps on the right greater than left gradually progressive with a CT scan of the abdomen suggesting some colitis near the surgical anastomosis. He had a right hemicolectomy in 2009 and followup colonoscopy by  Dr. Ewing Schlein in January of this year. He has not had any blood in his stool. He has had some interruption in his bowel movements with small and frequent stools. He's had one episode of vomiting. He has not had any fevers. He was started on Cipro and Flagyl seem to improve initially but is still  having persistent pain in his having difficulty advancing his diet and complaining of general abdominal distention.  HOSPITAL COURSE:  #1 abdominal pain. The patient had an extensive workup including a gastroenterology consultation had an EGD and a colonoscopy with results as above there was some suggestion of ischemic colitis at the neoterminal ileum. Per GI recommendations are [facility] consultation was obtained. They have reviewed his CT scan as well as his endoscopy reports. Did not feel that the patient needs any surgical intervention for anastomotic inflammatory changes, which are probably related to drug use, EtOH and tobacco abuse.  The patient has required IV Dilaudid and OxyIR around the clock. He does have some narcotic dependence. I have started him on MS Contin for long-acting coverage. He will continue with OxyIR a prescription for 60 tablets of MS Contin and 30 tablets of OxyIR have been provided.  There was also some suggestion of the possibility of a gastric emptying study, this can be done as an outpatient. The patient was found to have retained food in his stomach on his EGD. At this time the patient is okay to be discharged from a gastroenterology standpoint I did discuss the with the patient findings of the CT scan and the possibility of metastatic disease. For this the patient will followup with oncology Oncology to schedule a PET/CT scan if indicated  Because of his bloating abdominal distention. The patient has been advised to stay on an aggressive constipation regimen Have also advised him to eat small frequent meals and maintain by mouth hydration  Discharge Exam:  Blood pressure 118/81, pulse 76, temperature 97.8 F (36.6 C), temperature source Oral, resp. rate 20, height 5\' 7"  (1.702 m), weight 99.247 kg (218 lb 12.8 oz), SpO2 97.00%.  General Exam: Comfortable.  Respiratory System: Clear. No increased work of breathing.  Cardiovascular System: First and second heart sounds  heard. Regular rate and rhythm. No JVD/murmurs.  Gastrointestinal System: Abdomen is mildly distended, soft and normal bowel sounds heard. Still mildly tender in the right upper quadrant and mid quadrant. No rigidity or guarding or rebound.  Central Nervous System: Alert and oriented. No focal neurological deficits.  Extremities: Symmetrical 5 x 5 power.    Discharge Orders    Future Appointments: Provider: Department: Dept Phone: Center:   02/22/2012 8:45 AM Radene Gunning [facility] [phone] None   02/22/2012 9:15 AM Ladene Artist, MD [facility] [phone] None     Future Orders Please Complete By Expires   Diet - low sodium heart healthy      Increase activity slowly           Signed: Richarda Overlie 02/04/2012, 10:51 AM

## 2012-02-08 LAB — GLUCOSE, CAPILLARY
Glucose-Capillary: 105 mg/dL — ABNORMAL HIGH (ref 70–99)
Glucose-Capillary: 202 mg/dL — ABNORMAL HIGH (ref 70–99)

## 2012-02-22 ENCOUNTER — Ambulatory Visit (HOSPITAL_BASED_OUTPATIENT_CLINIC_OR_DEPARTMENT_OTHER): Payer: Self-pay | Admitting: Oncology

## 2012-02-22 ENCOUNTER — Telehealth: Payer: Self-pay | Admitting: Oncology

## 2012-02-22 ENCOUNTER — Other Ambulatory Visit: Payer: Self-pay | Admitting: *Deleted

## 2012-02-22 ENCOUNTER — Other Ambulatory Visit (HOSPITAL_BASED_OUTPATIENT_CLINIC_OR_DEPARTMENT_OTHER): Payer: Self-pay | Admitting: Lab

## 2012-02-22 VITALS — BP 108/70 | HR 79 | Temp 98.3°F | Ht 67.0 in | Wt 208.6 lb

## 2012-02-22 DIAGNOSIS — K5289 Other specified noninfective gastroenteritis and colitis: Secondary | ICD-10-CM

## 2012-02-22 DIAGNOSIS — G622 Polyneuropathy due to other toxic agents: Secondary | ICD-10-CM

## 2012-02-22 DIAGNOSIS — I10 Essential (primary) hypertension: Secondary | ICD-10-CM

## 2012-02-22 DIAGNOSIS — R109 Unspecified abdominal pain: Secondary | ICD-10-CM

## 2012-02-22 DIAGNOSIS — K529 Noninfective gastroenteritis and colitis, unspecified: Secondary | ICD-10-CM

## 2012-02-22 DIAGNOSIS — C189 Malignant neoplasm of colon, unspecified: Secondary | ICD-10-CM

## 2012-02-22 LAB — CBC WITH DIFFERENTIAL/PLATELET
EOS%: 3.1 % (ref 0.0–7.0)
Eosinophils Absolute: 0.2 10*3/uL (ref 0.0–0.5)
MCH: 29 pg (ref 27.2–33.4)
MCV: 87.3 fL (ref 79.3–98.0)
MONO%: 8 % (ref 0.0–14.0)
NEUT#: 4.1 10*3/uL (ref 1.5–6.5)
RBC: 4.82 10*6/uL (ref 4.20–5.82)
RDW: 15.3 % — ABNORMAL HIGH (ref 11.0–14.6)

## 2012-02-22 LAB — COMPREHENSIVE METABOLIC PANEL
AST: 84 U/L — ABNORMAL HIGH (ref 0–37)
Albumin: 4.7 g/dL (ref 3.5–5.2)
Alkaline Phosphatase: 85 U/L (ref 39–117)
Potassium: 4 mEq/L (ref 3.5–5.3)
Sodium: 138 mEq/L (ref 135–145)
Total Protein: 7.2 g/dL (ref 6.0–8.3)

## 2012-02-22 MED ORDER — MORPHINE SULFATE 15 MG PO TABS: 15.0000 mg | ORAL_TABLET | ORAL | Status: DC | PRN

## 2012-02-22 NOTE — Telephone Encounter (Signed)
Gv pt appt for aug2013.  scheduled appt with Dr. Daphine Deutscher for 06/13 @ 11:30am

## 2012-02-22 NOTE — Progress Notes (Signed)
[facility]    OFFICE PROGRESS NOTE   INTERVAL HISTORY:   He returns as scheduled. The neuropathy related pain is controlled with the current medical regimen. He continues to have numbness in the hands and feet.  He was admitted on 01/23/12 with abdominal pain. He underwent an extensive diagnostic evaluation and was found to have changes of the "colitis "near the surgical anastomosis by colonoscopy and CT. A 10 mm lymph node was noted right lower quadrant mesenteric and a 2.2 cm node was seen in the right upper mesentery.  Since discharge from the hospital he reports intermittent pain in the right mid abdomen. He describes the pain as a "cramping "discomfort it is worse after eating. He had an episode of emesis yesterday. He does not have consistent nausea. The stool is loose. He is taking MS Contin and oxycodone for pain.  Objective:  Vital signs in last 24 hours:  Blood pressure 108/70, pulse 79, temperature 98.3 F (36.8 C), temperature source Oral, height 5\' 7"  (1.702 m), weight 208 lb 9.6 oz (94.62 kg).    HEENT: Neck without mass Lymphatics: No cervical, supra-clavicular, axillary, or inguinal nodes Resp: Lungs clear bilaterally Cardio: Regular rate and rhythm GI: No hepatosplenomegaly, no mass, tenderness to superficial palpation at the right mid abdominal scar. There is more intense tenderness with deep palpation in the right midabdomen. No mass. Vascular: No leg edema Breasts: Right breast without mass     Lab Results:  Lab Results  Component Value Date   WBC 7.2 02/22/2012   HGB 14.0 02/22/2012   HCT 42.1 02/22/2012   MCV 87.3 02/22/2012   PLT 177 02/22/2012   CEA pending ANC 4.1   Medications: I have reviewed the patient's current medications.  Assessment/Plan: 1. Stage III (T3 N1) colon cancer status post right hemicolectomy 08/20/2010. 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 He began treatment with CAPOX on 09/24/2010. He  completed cycle 8 beginning 02/18/2011. Restaging CTs of the chest, abdomen, and pelvis on 08/25/2011 showed no evidence of metastatic disease 2. Staging CT of the chest 09/17/2010 negative for evidence of metastatic disease. 3. Anemia likely related to preoperative gastrointestinal bleeding: Resolved  4. History of alcohol use. 5. History of gout with an acute flare at the foot 11/26/2010. 6. Chronic obstructive pulmonary disease. 7. Hypertension. 8. Remote history of a bleeding gastric ulcer. 9. Family history of cancer. Microsatellite instability testing returned with a high microsatellite genotype. 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 10. Right-sided abdominal pain surrounding the transverse incision, persistent. Question neuropathic pain related to surgery and oxaliplatin. 11. Acute neurotoxicity related to oxaliplatin manifested with leg weakness, ataxia, blurred vision following cycle 2 on 10/25/2010. The oxaliplatin was dose reduced by 50% with cycle 3 and diluted in a larger volume with administration over 4 hours. The acute neurotoxicity did not recur. 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 He has pain associated with the neuropathy. The neuropathy pain has improved with Neurontin 13. Surveillance colonoscopy 08/17/2011 by Dr. Sherryl Barters 2 small benign polyps at the left colon. He underwent a colonoscopy in May 2013 with inflammation at the surgical anastomosis with an appearance of "ischemia ". There was diverticulosis and a colonoscopy was otherwise normal. 14.  Right flank pain and dysuria 11/29/2011. Urinalysis was remarkable for too numerous to count red cells. Abdominal x-ray showed no renal calculi. He did not complain of right flank pain or dysuria  at today's visit. 15. Right breast mass and palpable right axillary lymph node. He is status post a an ultrasound-guided biopsy of the right breast on 01/03/2012 with the  pathology confirming a fibroadenoma 16. Right abdominal pain-worse after eating,? Related to ischemic colitis. 17. Mesenteric lymph nodes noted on a CT 01/23/12-? Reactive   Disposition:  He remains symptomatic with severe "cramping "pain in the right abdomen. He has undergone an extensive diagnostic evaluation in the hospital. He continues MS Contin. I gave him a prescription for MSIR to use as needed for breakthrough pain. I will contact Dr. Daphine Deutscher to request a followup surgical evaluation. I have a low clinical suspicion for recurrent colon cancer.  Mr. Skeet will return for an office visit in 2 months.   Thornton Papas, MD  02/22/2012  8:27 PM

## 2012-02-23 ENCOUNTER — Telehealth: Payer: Self-pay | Admitting: *Deleted

## 2012-02-23 NOTE — Telephone Encounter (Signed)
Message copied by Caleb Popp on Wed Feb 23, 2012  3:34 PM ------      Message from: Ladene Artist      Created: Tue Feb 22, 2012 10:23 PM       Please call patient, cea is normal

## 2012-02-23 NOTE — Telephone Encounter (Signed)
Called pt with CEA results. He voiced understanding. Wanted Dr. Truett Perna to know he is seeing Dr. Daphine Deutscher on 02/24/12.

## 2012-02-24 ENCOUNTER — Ambulatory Visit (INDEPENDENT_AMBULATORY_CARE_PROVIDER_SITE_OTHER): Payer: Self-pay | Admitting: Surgery

## 2012-02-24 ENCOUNTER — Encounter (INDEPENDENT_AMBULATORY_CARE_PROVIDER_SITE_OTHER): Payer: Self-pay | Admitting: Surgery

## 2012-02-24 VITALS — BP 122/92 | HR 88 | Temp 97.6°F | Resp 14 | Ht 71.0 in | Wt 208.1 lb

## 2012-02-24 DIAGNOSIS — C189 Malignant neoplasm of colon, unspecified: Secondary | ICD-10-CM

## 2012-02-24 MED ORDER — CIPROFLOXACIN HCL 500 MG PO TABS: 500.0000 mg | ORAL_TABLET | Freq: Two times a day (BID) | ORAL | Status: AC

## 2012-02-24 MED ORDER — METRONIDAZOLE 500 MG PO TABS: 500.0000 mg | ORAL_TABLET | Freq: Three times a day (TID) | ORAL | Status: AC

## 2012-02-24 NOTE — Progress Notes (Signed)
Joe [name] 51 y.o.  Body mass index is 29.03 kg/(m^2).  Patient Active Problem List  Diagnosis  . Colitis  . Transaminitis  . Asthma  . HTN (hypertension)  . Anxiety and depression  . Alcohol abuse    No Known Allergies  Past Surgical History  Procedure Date  . Colon surgery 08/2010    colon cancer  . Endoscopic insertion peritoneal catheter port     placement  . Esophagogastroduodenoscopy     ulcers  . Colonoscopy 08/17/2011    Procedure: COLONOSCOPY;  Surgeon: Petra Kuba, MD;  Location: [facility];  Service: Endoscopy;  Laterality: N/A;  . Esophagogastroduodenoscopy 01/28/2012    Procedure: ESOPHAGOGASTRODUODENOSCOPY (EGD);  Surgeon: Barrie Folk, MD;  Location: [facility];  Service: Endoscopy;  Laterality: N/A;  . Colonoscopy 01/31/2012    Procedure: COLONOSCOPY;  Surgeon: Willis Modena, MD;  Location: [facility]COPY;  Service: Endoscopy;  Laterality: N/A;   Deloris Ping, MD 1. Colon cancer     I had spoken to Dr. Alisia Ferrari about [name]. It sounds like he is having an inflammatory condition of his terminal ileum and/or colon. He was said to have colitis when in the hospital recently. Before embarking on any surgery wanted to give him Cipro Flagyl empirically and also get a barium enema to look at his terminal ileum. If he has a string sign in his terminal ileum it does not respond to antibiotics and he is in need a resection. I hope we can avoid operating on him. I know he does have a low threshold for pain. However I think he is having some real issues that may be related to stricture.  Matt B. Daphine Deutscher, MD, [facility] [phone] beeper [phone]  02/24/2012 1:17 PM

## 2012-02-24 NOTE — Patient Instructions (Addendum)
Take Cipro Flagy as prescribed Have barium enema as ordered.

## 2012-02-29 ENCOUNTER — Ambulatory Visit (HOSPITAL_COMMUNITY)
Admission: RE | Admit: 2012-02-29 | Discharge: 2012-02-29 | Disposition: A | Discharge status: Home / Self Care | Payer: Self-pay | Source: Ambulatory Visit | Attending: Surgery | Admitting: Surgery

## 2012-02-29 ENCOUNTER — Other Ambulatory Visit (INDEPENDENT_AMBULATORY_CARE_PROVIDER_SITE_OTHER): Payer: Self-pay | Admitting: Surgery

## 2012-02-29 DIAGNOSIS — Z9049 Acquired absence of other specified parts of digestive tract: Secondary | ICD-10-CM | POA: Insufficient documentation

## 2012-02-29 DIAGNOSIS — C189 Malignant neoplasm of colon, unspecified: Secondary | ICD-10-CM

## 2012-02-29 DIAGNOSIS — Z980 Intestinal bypass and anastomosis status: Secondary | ICD-10-CM | POA: Insufficient documentation

## 2012-02-29 DIAGNOSIS — R1031 Right lower quadrant pain: Secondary | ICD-10-CM | POA: Insufficient documentation

## 2012-04-16 IMAGING — CR DG ABDOMEN 1V
1 series · 1 of 1 positions shown · non-contrast
Comparison: 08/18/2010

CLINICAL DATA: Constipation, abdominal discomfort

ABDOMEN - 1 VIEW

[t abdomen supine]
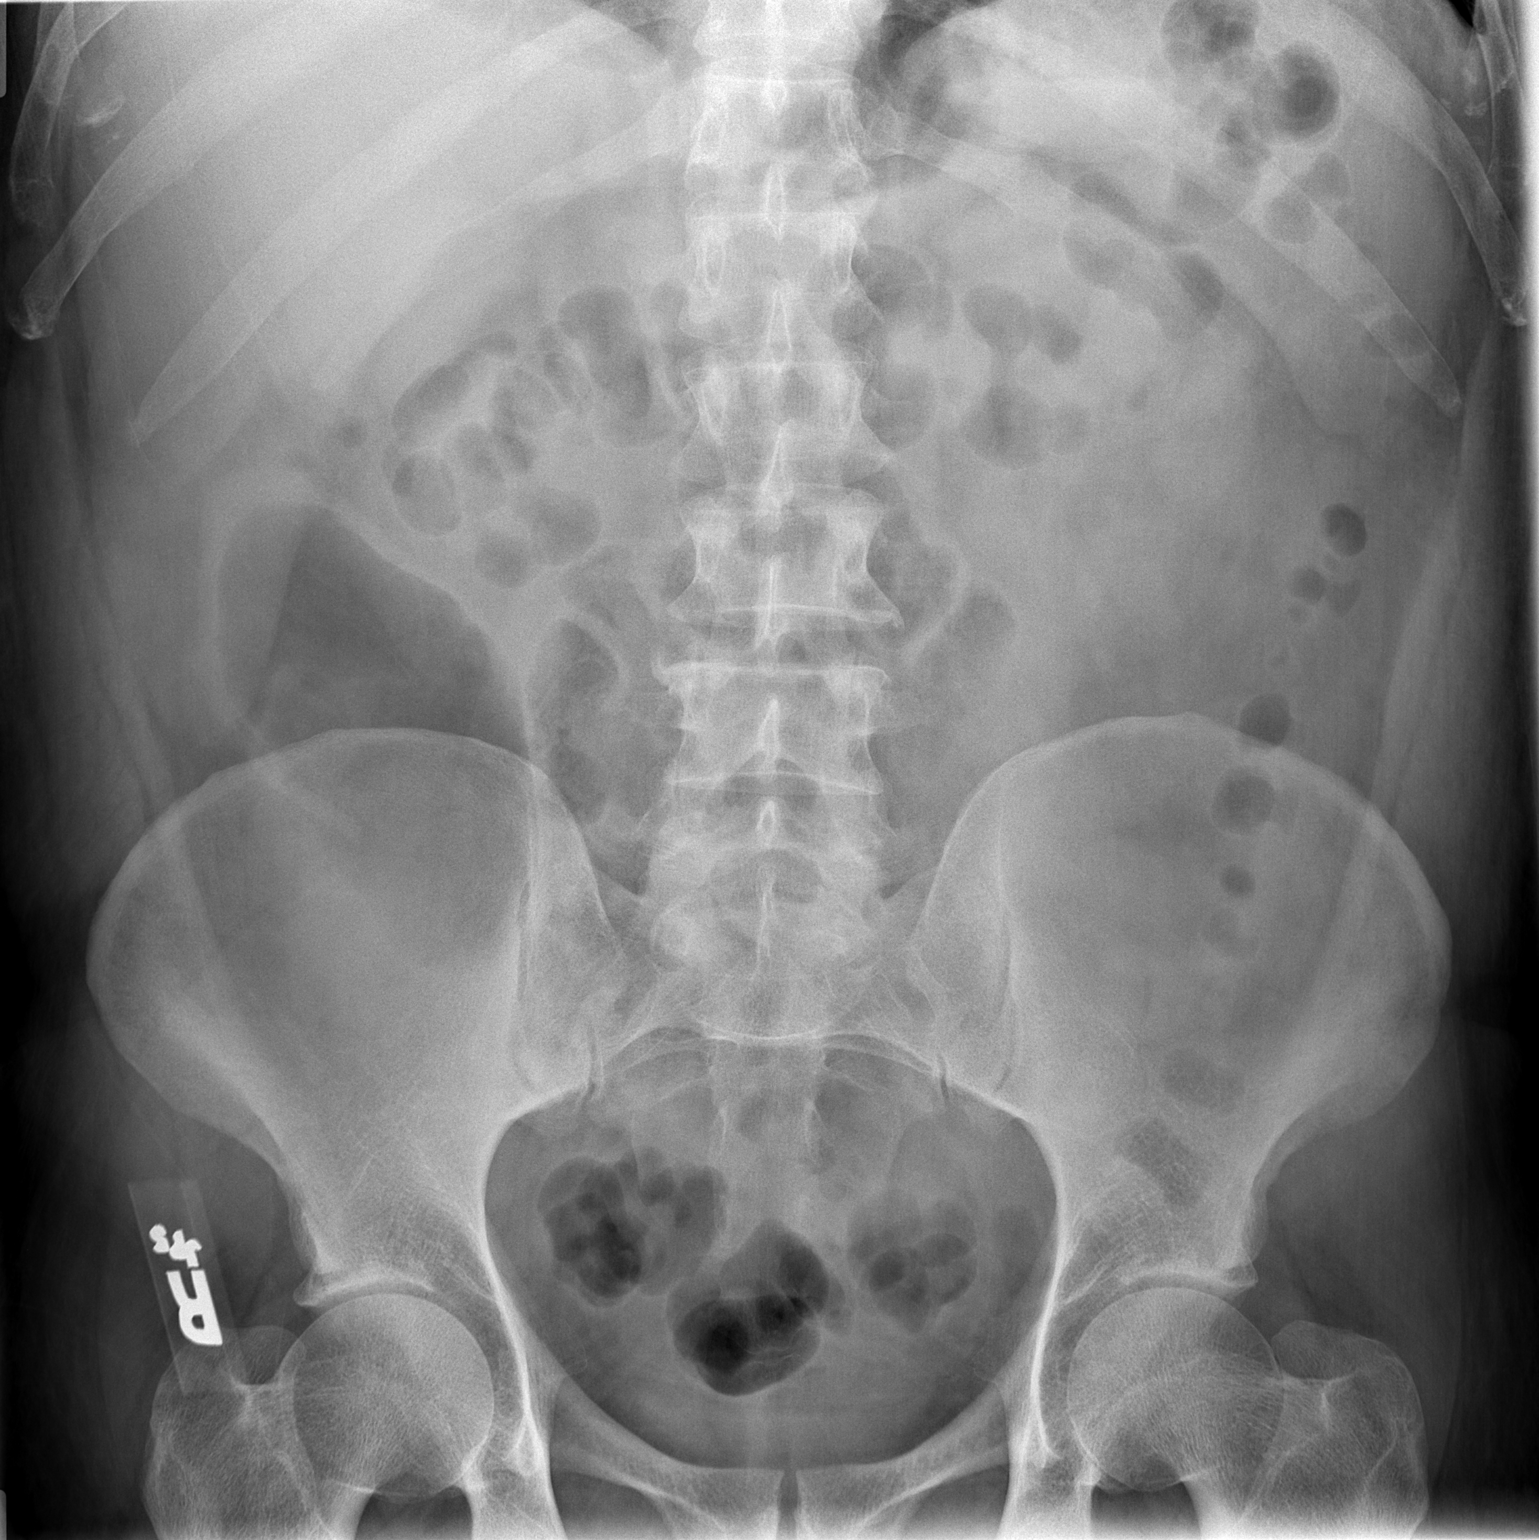

[1 of 1 positions shown; findings below may reference images not displayed]

FINDINGS: Interval passage of contrast is noted with no significant
residual contrast in nondilated small or large bowel.  No
appreciable stool burden visualized. Presence or absence of air
fluid levels or free air cannot be assessed on this single supine
view.
IMPRESSION: Normal bowel gas pattern with interval passage of contrast and no
appreciable significant stool burden.

## 2012-04-25 ENCOUNTER — Ambulatory Visit (HOSPITAL_BASED_OUTPATIENT_CLINIC_OR_DEPARTMENT_OTHER): Payer: Self-pay | Admitting: Nurse Practitioner

## 2012-04-25 ENCOUNTER — Encounter: Payer: Self-pay | Admitting: Nurse Practitioner

## 2012-04-25 ENCOUNTER — Telehealth: Payer: Self-pay | Admitting: Oncology

## 2012-04-25 VITALS — BP 129/77 | HR 90 | Temp 97.0°F | Resp 18 | Ht 71.0 in | Wt 218.1 lb

## 2012-04-25 DIAGNOSIS — R209 Unspecified disturbances of skin sensation: Secondary | ICD-10-CM

## 2012-04-25 DIAGNOSIS — N6310 Unspecified lump in the right breast, unspecified quadrant: Secondary | ICD-10-CM

## 2012-04-25 DIAGNOSIS — Z85038 Personal history of other malignant neoplasm of large intestine: Secondary | ICD-10-CM

## 2012-04-25 DIAGNOSIS — C189 Malignant neoplasm of colon, unspecified: Secondary | ICD-10-CM

## 2012-04-25 DIAGNOSIS — R109 Unspecified abdominal pain: Secondary | ICD-10-CM

## 2012-04-25 DIAGNOSIS — N63 Unspecified lump in unspecified breast: Secondary | ICD-10-CM

## 2012-04-25 NOTE — Progress Notes (Signed)
OFFICE PROGRESS NOTE  Interval history:  Joe [name] returns as scheduled. The intermittent pain in the right mid abdomen is better. The chronic pain at the right abdomen is unchanged. He takes Percocet typically twice a day. He reports stable numbness in the hands and feet. He continues Neurontin 300 mg 3 times daily. He has recently had some "cramps" involving the fingers and toes. He also reports recent headaches and dizzy spells. He is concerned the fullness in the right breast has increased. He has intermittent pain in the right breast. He reports a lack of energy. Some days he stays in bed all day. He reports his blood pressure medication was recently adjusted. He is taking Valium twice a day and Ativan at bedtime due to anxiety.   Objective: Blood pressure 129/77, pulse 90, temperature 97 F (36.1 C), temperature source Oral, resp. rate 18, height 5\' 11"  (1.803 m), weight 218 lb 1.6 oz (98.93 kg).  Oropharynx is without thrush or ulceration. No palpable cervical, supraclavicular, axillary or inguinal lymph nodes. Approximate 3 cm area of fullness at the right superior aerola. No discrete mass. Lungs are clear. Regular cardiac rhythm. Abdomen is soft. Tender at the right abdomen. No hepatomegaly. No mass. Extremities are without edema. Vibratory sense is markedly decreased over the fingertips per tuning fork exam.  Lab Results: Lab Results  Component Value Date   WBC 7.2 02/22/2012   HGB 14.0 02/22/2012   HCT 42.1 02/22/2012   MCV 87.3 02/22/2012   PLT 177 02/22/2012    Chemistry:    Chemistry      Component Value Date/Time   NA 138 02/22/2012 0840   NA 140 08/25/2011 0747   K 4.0 02/22/2012 0840   K 4.9* 08/25/2011 0747   CL 104 02/22/2012 0840   CL 100 08/25/2011 0747   CO2 24 02/22/2012 0840   CO2 28 08/25/2011 0747   BUN 12 02/22/2012 0840   BUN 21 08/25/2011 0747   CREATININE 1.27 02/22/2012 0840   CREATININE 1.4* 08/25/2011 0747      Component Value Date/Time   CALCIUM 9.5  02/22/2012 0840   CALCIUM 9.1 08/25/2011 0747   ALKPHOS 85 02/22/2012 0840   ALKPHOS 83 08/25/2011 0747   AST 84* 02/22/2012 0840   AST 51* 08/25/2011 0747   ALT 95* 02/22/2012 0840   BILITOT 0.7 02/22/2012 0840   BILITOT 0.40 08/25/2011 0747       Studies/Results: No results found.  Medications: I have reviewed the patient's current medications.  Assessment/Plan:  1. Stage III (T3 N1) colon cancer status post right hemicolectomy 08/20/2010. 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 He began treatment with CAPOX on 09/24/2010. He completed cycle 8 beginning 02/18/2011. Restaging CTs of the chest, abdomen, and pelvis on 08/25/2011 showed no evidence of metastatic disease. 2. Staging CT of the chest 09/17/2010 negative for evidence of metastatic disease. 3. Anemia likely related to preoperative gastrointestinal bleeding: Resolved  4. History of alcohol use. 5. History of gout with an acute flare at the foot 11/26/2010. 6. Chronic obstructive pulmonary disease. 7. Hypertension. 8. Remote history of a bleeding gastric ulcer. 9. Family history of cancer. Microsatellite instability testing returned with a high microsatellite genotype. 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 10. Right-sided abdominal pain surrounding the transverse incision, persistent. Question neuropathic pain related to surgery and oxaliplatin. 11. Acute neurotoxicity related to oxaliplatin manifested with leg weakness, ataxia, blurred vision  following cycle 2 on 10/25/2010. The oxaliplatin was dose reduced by 50% with cycle 3 and diluted in a larger volume with administration over 4 hours. The acute neurotoxicity did not recur. 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 He has pain associated with the neuropathy. The neuropathy pain has improved with Neurontin 13. Surveillance colonoscopy 08/17/2011 by Dr. Ewing Schlein with 2 small benign polyps at the left colon.  He underwent a colonoscopy in May 2013 with inflammation at the surgical anastomosis with an appearance of "ischemia ". There was diverticulosis and a colonoscopy was otherwise normal. 14. Right flank pain and dysuria 11/29/2011. Urinalysis was remarkable for too numerous to count red cells. Abdominal x-ray showed no renal calculi. He did not complain of flank pain or dysuria at today's visit. 15. Right breast mass and palpable right axillary lymph node. He is status post a an ultrasound-guided biopsy of the right breast on 01/03/2012 with the pathology confirming a fibroadenoma. Stable fullness at the right breast on exam today. Followup mammogram planned at a six-month interval (October 2013). 16. Right abdominal pain May 2013. Question colitis. Resolved. 17. Mesenteric lymph nodes noted on a CT 01/23/12-question reactive. Plan to reevaluate on CT in December of this year.  Disposition-[name] appears stable from an oncology standpoint. He remains in clinical remission from the colon cancer. We will schedule the next restaging CT evaluation and CEA in December of this year.   He will undergo a followup right-sided mammogram in October of this year.   He will schedule a followup appointment with his primary care provider for evaluation of the hand cramps, dizzy spells, headaches and fatigue.  He will return for a followup visit with Dr. Truett Perna in December following the CT scan. He will contact the office in the interim with any problems. We specifically discussed progression of the fullness at the right breast.  Patient seen with Dr. Truett Perna.  Lonna Cobb ANP/GNP-BC

## 2012-04-25 NOTE — Progress Notes (Signed)
Mr. Vereen will also have an ultrasound of the right breast in October 2013.

## 2012-04-25 NOTE — Telephone Encounter (Signed)
appts made and printed for pt,note to lisa t regarding mammo ? U.s pt aware that i will call

## 2012-05-31 ENCOUNTER — Encounter (HOSPITAL_COMMUNITY): Payer: Self-pay | Admitting: *Deleted

## 2012-05-31 ENCOUNTER — Emergency Department (HOSPITAL_COMMUNITY)
Admission: EM | Admit: 2012-05-31 | Discharge: 2012-06-01 | Disposition: A | Discharge status: Home / Self Care | Payer: Self-pay | Attending: Emergency Medicine | Admitting: Emergency Medicine

## 2012-05-31 ENCOUNTER — Emergency Department (HOSPITAL_COMMUNITY): Payer: Self-pay

## 2012-05-31 DIAGNOSIS — K7689 Other specified diseases of liver: Secondary | ICD-10-CM | POA: Insufficient documentation

## 2012-05-31 DIAGNOSIS — M549 Dorsalgia, unspecified: Secondary | ICD-10-CM | POA: Insufficient documentation

## 2012-05-31 DIAGNOSIS — Z85038 Personal history of other malignant neoplasm of large intestine: Secondary | ICD-10-CM | POA: Insufficient documentation

## 2012-05-31 DIAGNOSIS — Z9049 Acquired absence of other specified parts of digestive tract: Secondary | ICD-10-CM | POA: Insufficient documentation

## 2012-05-31 DIAGNOSIS — R10813 Right lower quadrant abdominal tenderness: Secondary | ICD-10-CM | POA: Insufficient documentation

## 2012-05-31 DIAGNOSIS — R109 Unspecified abdominal pain: Secondary | ICD-10-CM | POA: Insufficient documentation

## 2012-05-31 DIAGNOSIS — R141 Gas pain: Secondary | ICD-10-CM | POA: Insufficient documentation

## 2012-05-31 DIAGNOSIS — R142 Eructation: Secondary | ICD-10-CM | POA: Insufficient documentation

## 2012-05-31 DIAGNOSIS — I10 Essential (primary) hypertension: Secondary | ICD-10-CM | POA: Insufficient documentation

## 2012-05-31 DIAGNOSIS — R143 Flatulence: Secondary | ICD-10-CM | POA: Insufficient documentation

## 2012-05-31 MED ORDER — HYDROMORPHONE HCL PF 1 MG/ML IJ SOLN
1.0000 mg | Freq: Once | INTRAMUSCULAR | Status: AC
  Administered 2012-05-31: 1 mg via INTRAVENOUS
  Filled 2012-05-31: qty 1

## 2012-05-31 MED ORDER — ONDANSETRON HCL 4 MG/2ML IJ SOLN
4.0000 mg | Freq: Once | INTRAMUSCULAR | Status: AC
  Administered 2012-05-31: 4 mg via INTRAVENOUS
  Filled 2012-05-31: qty 2

## 2012-05-31 NOTE — ED Notes (Signed)
Pt c/o severe abd pain tonight; pain off and on x 2 days; history of colon resection 2011; blockage in 5/13--states feels the same; nauseated; took laxative two days ago with watery bowel movement yesterday

## 2012-06-01 ENCOUNTER — Emergency Department (HOSPITAL_COMMUNITY): Payer: Self-pay

## 2012-06-01 LAB — COMPREHENSIVE METABOLIC PANEL
ALT: 159 U/L — ABNORMAL HIGH (ref 0–53)
AST: 123 U/L — ABNORMAL HIGH (ref 0–37)
Calcium: 9.6 mg/dL (ref 8.4–10.5)
GFR calc Af Amer: 82 mL/min — ABNORMAL LOW (ref >90)
Glucose, Bld: 131 mg/dL — ABNORMAL HIGH (ref 70–99)
Sodium: 134 mEq/L — ABNORMAL LOW (ref 135–145)
Total Protein: 7.4 g/dL (ref 6.0–8.3)

## 2012-06-01 LAB — CBC WITH DIFFERENTIAL/PLATELET
Basophils Absolute: 0 10*3/uL (ref 0.0–0.1)
Eosinophils Absolute: 0.1 10*3/uL (ref 0.0–0.7)
Eosinophils Relative: 2 % (ref 0–5)
MCH: 30.3 pg (ref 26.0–34.0)
MCV: 88.4 fL (ref 78.0–100.0)
Platelets: 189 10*3/uL (ref 150–400)
RDW: 13.8 % (ref 11.5–15.5)

## 2012-06-01 LAB — URINALYSIS, MICROSCOPIC ONLY
Bilirubin Urine: NEGATIVE
Nitrite: NEGATIVE
Specific Gravity, Urine: 1.009 (ref 1.005–1.030)
Urine-Other: NONE SEEN
pH: 6 (ref 5.0–8.0)

## 2012-06-01 LAB — LACTIC ACID, PLASMA: Lactic Acid, Venous: 0.9 mmol/L (ref 0.5–2.2)

## 2012-06-01 MED ORDER — HYDROMORPHONE HCL PF 1 MG/ML IJ SOLN: 1.0000 mg | Freq: Once | INTRAMUSCULAR | Status: DC

## 2012-06-01 MED ORDER — IOHEXOL 300 MG/ML  SOLN
100.0000 mL | Freq: Once | INTRAMUSCULAR | Status: AC | PRN
  Administered 2012-06-01: 100 mL via INTRAVENOUS

## 2012-06-01 MED ORDER — OXYCODONE-ACETAMINOPHEN 5-325 MG PO TABS: 1.0000 | ORAL_TABLET | Freq: Four times a day (QID) | ORAL | Status: DC | PRN

## 2012-06-01 MED ORDER — HYDROMORPHONE HCL PF 1 MG/ML IJ SOLN
1.0000 mg | Freq: Once | INTRAMUSCULAR | Status: AC
  Administered 2012-06-01: 1 mg via INTRAVENOUS
  Filled 2012-06-01: qty 1

## 2012-06-01 MED ORDER — FENTANYL CITRATE 0.05 MG/ML IJ SOLN
50.0000 ug | Freq: Once | INTRAMUSCULAR | Status: AC
  Administered 2012-06-01: 50 ug via INTRAVENOUS
  Filled 2012-06-01: qty 2

## 2012-06-01 MED ORDER — ONDANSETRON 4 MG PO TBDP: 4.0000 mg | ORAL_TABLET | Freq: Three times a day (TID) | ORAL | Status: DC | PRN

## 2012-06-01 NOTE — ED Provider Notes (Signed)
History     CSN: [id number]  Arrival date & time 05/31/12  2259   First MD Initiated Contact with Patient 05/31/12 2308      Chief Complaint  Patient presents with  . Abdominal Pain    (Consider location/radiation/quality/duration/timing/severity/associated sxs/prior treatment) HPI Comments: Joe [name] presents ambulatory with his wife for evaluation of severe abdominal pain.  He reports it occurs in waves and feels as if he should be having a bowel movement.  He states he has not passed gas since a small BM early this morning.  He denies diarrhea, fever, dysuria, or any other issues other than pain and nausea.  Patient is a 51 y.o. male presenting with abdominal pain. The history is provided by the patient. No language interpreter was used.  Abdominal Pain The primary symptoms of the illness include abdominal pain and nausea. The primary symptoms of the illness do not include fever, fatigue, vomiting, diarrhea or dysuria. Episode onset: 2-3 days, increased in persistence and intensity today. The onset of the illness was gradual.  Associated with: does use EtOH and colace daily. The patient has had a change in bowel habit (feels constipated.). Additional symptoms associated with the illness include constipation and back pain. Symptoms associated with the illness do not include chills, diaphoresis, heartburn, urgency, hematuria or frequency. Significant associated medical issues include GERD. Significant associated medical issues do not include PUD, inflammatory bowel disease, diabetes, sickle cell disease, gallstones, diverticulitis or cardiac disease. Associated medical issues comments: colon cancer with previous partial collectomy.  hx bowel obstruction also.Marland Kitchen    Past Medical History  Diagnosis Date  . Hyperlipidemia   . Hypertension   . GERD (gastroesophageal reflux disease)   . COPD (chronic obstructive pulmonary disease)   . Cancer     colon  . Depression   . Anxiety      Past Surgical History  Procedure Date  . Colon surgery 08/2010    colon cancer  . Endoscopic insertion peritoneal catheter port     placement  . Esophagogastroduodenoscopy     ulcers  . Colonoscopy 08/17/2011    Procedure: COLONOSCOPY;  Surgeon: Petra Kuba, MD;  Location: [facility];  Service: Endoscopy;  Laterality: N/A;  . Esophagogastroduodenoscopy 01/28/2012    Procedure: ESOPHAGOGASTRODUODENOSCOPY (EGD);  Surgeon: Barrie Folk, MD;  Location: [facility];  Service: Endoscopy;  Laterality: N/A;  . Colonoscopy 01/31/2012    Procedure: COLONOSCOPY;  Surgeon: Willis Modena, MD;  Location: [facility];  Service: Endoscopy;  Laterality: N/A;    Family History  Problem Relation Age of Onset  . Anesthesia problems Neg Hx   . Hypotension Neg Hx   . Malignant hyperthermia Neg Hx   . Pseudochol deficiency Neg Hx     History  Substance Use Topics  . Smoking status: Former Smoker -- 1.0 packs/day    Types: Cigarettes  . Smokeless tobacco: Never Used  . Alcohol Use: 16.8 oz/week    28 Cans of beer per week     weekly      Review of Systems  Constitutional: Positive for appetite change. Negative for fever, chills, diaphoresis, activity change and fatigue.  HENT: Negative.   Eyes: Negative.   Respiratory: Negative.   Cardiovascular: Negative.   Gastrointestinal: Positive for nausea, abdominal pain, constipation and abdominal distention. Negative for heartburn, vomiting, diarrhea, blood in stool and anal bleeding.  Genitourinary: Negative.  Negative for dysuria, urgency, frequency, hematuria, flank pain, decreased urine volume and difficulty urinating.  Musculoskeletal: Positive for back pain.  Skin: Negative.   Neurological: Negative.   Psychiatric/Behavioral: Negative.     Allergies  Review of patient's allergies indicates no known allergies.  Home Medications   Current Outpatient Rx  Name Route Sig Dispense Refill  . ALBUTEROL SULFATE HFA 108 (90 BASE)  MCG/ACT IN AERS Inhalation Inhale 2 puffs into the lungs every 6 (six) hours as needed. Wheezing or shortness of breath.    . ALLOPURINOL 300 MG PO TABS Oral Take 300 mg by mouth daily.      . BUDESONIDE-FORMOTEROL FUMARATE 80-4.5 MCG/ACT IN AERO Inhalation Inhale 2 puffs into the lungs 2 (two) times daily.      Marland Kitchen CITALOPRAM HYDROBROMIDE 40 MG PO TABS Oral Take 40 mg by mouth daily.     Marland Kitchen DIAZEPAM 10 MG PO TABS Oral Take 5 mg by mouth 2 (two) times daily.     Marland Kitchen DOCUSATE SODIUM 100 MG PO CAPS Oral Take 100 mg by mouth daily.    Marland Kitchen GABAPENTIN 300 MG PO CAPS Oral Take 600 mg by mouth 2 (two) times daily.    Marland Kitchen LISINOPRIL 20 MG PO TABS Oral Take 1 tablet (20 mg total) by mouth daily. 30 tablet 0  . LORAZEPAM 1 MG PO TABS Oral Take 1 mg by mouth every 6 (six) hours as needed.     . OXYCODONE-ACETAMINOPHEN 5-325 MG PO TABS Oral Take 1 tablet by mouth every 6 (six) hours as needed.    Marland Kitchen PANTOPRAZOLE SODIUM 20 MG PO TBEC Oral Take 20 mg by mouth daily.      BP 145/93  Pulse 92  Temp 98 F (36.7 C) (Oral)  Resp 22  SpO2 98%  Physical Exam  Nursing note and vitals reviewed. Constitutional: He is oriented to person, place, and time. He appears well-developed and well-nourished. No distress.  HENT:  Head: Normocephalic and atraumatic.  Right Ear: External ear normal.  Left Ear: External ear normal.  Nose: Nose normal.  Mouth/Throat: Oropharynx is clear and moist. No oropharyngeal exudate.  Eyes: Conjunctivae normal are normal. Pupils are equal, round, and reactive to light. Right eye exhibits no discharge. Left eye exhibits no discharge. No scleral icterus.  Neck: Normal range of motion. Neck supple. No JVD present. No tracheal deviation present. No thyromegaly present.  Cardiovascular: Normal rate, regular rhythm, normal heart sounds and intact distal pulses.  Exam reveals no gallop and no friction rub.   No murmur heard. Pulmonary/Chest: Effort normal and breath sounds normal. No stridor. No  respiratory distress. He has no wheezes. He has no rales. He exhibits no tenderness.  Abdominal: Soft. Bowel sounds are normal. He exhibits distension. He exhibits no shifting dullness, no pulsatile liver, no fluid wave, no abdominal bruit, no ascites, no pulsatile midline mass and no mass. There is no hepatosplenomegaly. There is tenderness in the right lower quadrant. There is no rigidity, no rebound, no guarding, no CVA tenderness, no tenderness at McBurney's point and negative Murphy's sign. No hernia.         Abdomen protuberant, mildly distended but soft.    Musculoskeletal: Normal range of motion. He exhibits no edema and no tenderness.  Lymphadenopathy:    He has no cervical adenopathy.  Neurological: He is alert and oriented to person, place, and time.  Skin: Skin is warm. No rash noted. He is not diaphoretic. No erythema. No pallor.  Psychiatric: He has a normal mood and affect. His behavior is normal.    ED Course  Procedures (including critical care time)  Labs Reviewed  COMPREHENSIVE METABOLIC PANEL - Abnormal; Notable for the following:    Sodium 134 (*)     Glucose, Bld 131 (*)     AST 123 (*)     ALT 159 (*)     GFR calc non Af Amer 71 (*)     GFR calc Af Amer 82 (*)     All other components within normal limits  CBC WITH DIFFERENTIAL  URINALYSIS, MICROSCOPIC ONLY   Ct Abdomen Pelvis W Contrast  06/01/2012  *RADIOLOGY REPORT*  Clinical Data: Abdominal pain and nausea.  History of prior partial colectomy.  CT ABDOMEN AND PELVIS WITH CONTRAST  Technique:  Multidetector CT imaging of the abdomen and pelvis was performed following the standard protocol during bolus administration of intravenous contrast.  Contrast: OMNIPAQUE IOHEXOL 300 MG/ML  SOLN  Comparison: CT abdomen and pelvis 01/23/2012.  Findings: The lung bases are clear.  There is no pleural or pericardial effusion.  The liver is diffusely low attenuating compatible with fatty infiltration.  No focal liver  lesion.  The gallbladder, biliary tree, adrenal glands, spleen, pancreas and kidneys appear normal. Surgical anastomosis in the proximal transverse colon for right hemicolectomy is again seen.  There is no complicating feature.  A few diverticula are identified.  The colon is otherwise unremarkable.  Stomach and small bowel appear normal.  There is no lymphadenopathy or fluid. Previously seen right lower quadrant lymph node which had measured 1.3 x 2.2 cm has decreased in size measuring 1.3 x 0.7 cm on image 41.  Urinary bladder, seminal vesicles and prostate gland are unremarkable.  Fat containing left inguinal hernia is noted.  There is no focal bony abnormality.  IMPRESSION:  1.  No acute finding. 2.  Status post right hemicolectomy without evidence of complication. 3.  Fatty infiltration of the liver   Original Report Authenticated By: Bernadene Bell. Maricela Curet, M.D.      No diagnosis found.    MDM  Pt presents for evaluation of severe abdominal pain.  It has been associated with nausea but he has had no vomiting.  He appears uncomfortable, stable VS, NAD.  Pt has diffuse rt lower pain on exam without rigidity, rebound, or guarding.  He states this pain is similar to the bowel obstruction he experienced in May.  Plan routine belly labs, pain mgmnt, antiemetics, and a contrasted CT scan of the abdomen.  Will review results as available and perform serial exams.  0210.  Pt stable, NAD.  He has required several doses of dilaudid and still complains of significant pain.  The pain is not constant but occurs in waves.  Note elevations in AST (123) and ALT (159) with a normal ALK Phos and T Bili.  On repeat exam he has no specific RUQ tenderness.  He admits to alcohol use/abuse and states he has been told previously that his liver function studies were abnormal.  He reports drinking 4 beers daily but no "hard" EtOH.  CT scan of abdomen demonstrates no evidence of bowel obstruction or stool impaction.  The  gallbladder and biliary tree are also reported to appear normal.  Colonic diverticula were noted without evidence of diverticulitis or colitis.  No free fluid identified.  Note nl U/A.  0500.  Lactic acid negative.  Pain improved.  Unable to explain pt's pain.  Encouraged outpt f/u with his gastroenterologist.  Plan discharge home.  Discussed indications for immediate return to the emergency department.  Tobin Chad, MD 06/01/12 [phone]

## 2012-06-26 ENCOUNTER — Ambulatory Visit
Admission: RE | Admit: 2012-06-26 | Discharge: 2012-06-26 | Disposition: A | Discharge status: Home / Self Care | Payer: Self-pay | Source: Ambulatory Visit | Attending: Nurse Practitioner | Admitting: Nurse Practitioner

## 2012-06-26 DIAGNOSIS — N6310 Unspecified lump in the right breast, unspecified quadrant: Secondary | ICD-10-CM

## 2012-07-31 ENCOUNTER — Telehealth: Payer: Self-pay | Admitting: *Deleted

## 2012-07-31 NOTE — Telephone Encounter (Signed)
Called both numbers for patient to inquire about need for refill for lorazepam 1 mg last filled 09-07-2012.  No quantity or directions on the [facility] [employer] Refill request.  Awaiting return call from patient.

## 2012-08-02 ENCOUNTER — Other Ambulatory Visit: Payer: Self-pay | Admitting: *Deleted

## 2012-08-02 DIAGNOSIS — F419 Anxiety disorder, unspecified: Secondary | ICD-10-CM

## 2012-08-02 DIAGNOSIS — F341 Dysthymic disorder: Secondary | ICD-10-CM

## 2012-08-02 MED ORDER — LORAZEPAM 1 MG PO TABS: 1.0000 mg | ORAL_TABLET | Freq: Every evening | ORAL | Status: DC | PRN

## 2012-08-02 NOTE — Telephone Encounter (Signed)
Wife called back to report patient only takes Ativan at bedtime to help sleep since he completed his chemo.

## 2012-08-21 ENCOUNTER — Other Ambulatory Visit (HOSPITAL_BASED_OUTPATIENT_CLINIC_OR_DEPARTMENT_OTHER): Payer: Self-pay | Admitting: Lab

## 2012-08-21 ENCOUNTER — Ambulatory Visit (HOSPITAL_COMMUNITY)
Admission: RE | Admit: 2012-08-21 | Discharge: 2012-08-21 | Disposition: A | Discharge status: Home / Self Care | Payer: Self-pay | Source: Ambulatory Visit | Attending: Nurse Practitioner | Admitting: Nurse Practitioner

## 2012-08-21 DIAGNOSIS — Z85038 Personal history of other malignant neoplasm of large intestine: Secondary | ICD-10-CM | POA: Insufficient documentation

## 2012-08-21 DIAGNOSIS — C189 Malignant neoplasm of colon, unspecified: Secondary | ICD-10-CM

## 2012-08-21 DIAGNOSIS — K7689 Other specified diseases of liver: Secondary | ICD-10-CM | POA: Insufficient documentation

## 2012-08-21 LAB — COMPREHENSIVE METABOLIC PANEL (CC13)
ALT: 72 U/L — ABNORMAL HIGH (ref 0–55)
AST: 41 U/L — ABNORMAL HIGH (ref 5–34)
Albumin: 3.4 g/dL — ABNORMAL LOW (ref 3.5–5.0)
Alkaline Phosphatase: 76 U/L (ref 40–150)
BUN: 13 mg/dL (ref 7.0–26.0)
Calcium: 8.8 mg/dL (ref 8.4–10.4)
Chloride: 107 mEq/L (ref 98–107)
Creatinine: 1.1 mg/dL (ref 0.7–1.3)
Potassium: 4.3 mEq/L (ref 3.5–5.1)

## 2012-08-21 LAB — CBC WITH DIFFERENTIAL/PLATELET
BASO%: 0.6 % (ref 0.0–2.0)
EOS%: 5.1 % (ref 0.0–7.0)
MCH: 32.3 pg (ref 27.2–33.4)
MCHC: 34.6 g/dL (ref 32.0–36.0)
MONO#: 0.4 10*3/uL (ref 0.1–0.9)
RBC: 4.1 10*6/uL — ABNORMAL LOW (ref 4.20–5.82)
RDW: 13.5 % (ref 11.0–14.6)
WBC: 4.5 10*3/uL (ref 4.0–10.3)
lymph#: 1.4 10*3/uL (ref 0.9–3.3)

## 2012-08-21 LAB — CEA: CEA: 3.3 ng/mL (ref 0.0–5.0)

## 2012-08-21 MED ORDER — IOHEXOL 300 MG/ML  SOLN
100.0000 mL | Freq: Once | INTRAMUSCULAR | Status: AC | PRN
  Administered 2012-08-21: 100 mL via INTRAVENOUS

## 2012-08-24 ENCOUNTER — Ambulatory Visit (HOSPITAL_BASED_OUTPATIENT_CLINIC_OR_DEPARTMENT_OTHER): Payer: Self-pay | Admitting: Oncology

## 2012-08-24 ENCOUNTER — Ambulatory Visit: Payer: Self-pay

## 2012-08-24 ENCOUNTER — Other Ambulatory Visit (HOSPITAL_COMMUNITY)
Admission: RE | Admit: 2012-08-24 | Discharge: 2012-08-24 | Disposition: A | Discharge status: Home / Self Care | Payer: Self-pay | Source: Ambulatory Visit | Attending: Oncology | Admitting: Oncology

## 2012-08-24 ENCOUNTER — Telehealth: Payer: Self-pay | Admitting: Oncology

## 2012-08-24 VITALS — BP 138/80 | HR 82 | Temp 97.2°F | Resp 18 | Ht 71.0 in | Wt 224.5 lb

## 2012-08-24 DIAGNOSIS — R109 Unspecified abdominal pain: Secondary | ICD-10-CM

## 2012-08-24 DIAGNOSIS — C189 Malignant neoplasm of colon, unspecified: Secondary | ICD-10-CM | POA: Insufficient documentation

## 2012-08-24 NOTE — Telephone Encounter (Signed)
Gave pt appt for June 2014 lab and ML °

## 2012-08-24 NOTE — Progress Notes (Signed)
[facility]    OFFICE PROGRESS NOTE   INTERVAL HISTORY:   He returns as scheduled. He reports stable neuropathy symptoms in the hands and feet. Neurontin helps. He is able to work.  He has intermittent right abdominal pain. Persistent tenderness at the right abdomen surgical site. He reports improvement in heel pain after receiving steroid injections by his primary physician.  Objective:  Vital signs in last 24 hours:  Blood pressure 138/80, pulse 82, temperature 97.2 F (36.2 C), temperature source Oral, resp. rate 18, height 5\' 11"  (1.803 m), weight 224 lb 8 oz (101.833 kg).    HEENT: Neck without mass Lymphatics: No cervical, supraclavicular, axillary, or inguinal nodes Resp: Lungs clear bilaterally Cardio: Regular rate and rhythm GI: No hepatomegaly, no mass, tenderness surrounding the right abdominal scar with superficial palpation. No tenderness in the right lower abdomen. Vascular: No leg edema Breasts: Right breast without mass  Lab Results:  Lab Results  Component Value Date   WBC 4.5 08/21/2012   HGB 13.2 08/21/2012   HCT 38.2* 08/21/2012   MCV 93.2 08/21/2012   PLT 144 08/21/2012   ANC 2.4  CEA 3.3  X-rays: CT abdomen on 08/21/2012-lung bases are clear. No focal liver lesions. No enlarged lymph nodes in the upper abdomen. Previously seen ileocolic node measures 0.8 cm compared to 0.7 cm. No pelvic or inguinal adenopathy. No evidence for residual or recurrent tumor.    Medications: I have reviewed the patient's current medications.  Assessment/Plan: 1. Stage III (T3 N1) colon cancer status post right hemicolectomy 08/20/2010. 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 He began treatment with CAPOX on 09/24/2010. He completed cycle 8 beginning 02/18/2011. Restaging CTs of the chest, abdomen, and pelvis on 08/25/2011 showed no evidence of metastatic disease. 2. Staging CT of the chest 09/17/2010 negative for evidence of metastatic  disease. 3. Anemia likely related to preoperative gastrointestinal bleeding: Resolved  4. History of alcohol use. 5. History of gout with an acute flare at the foot 11/26/2010. 6. Chronic obstructive pulmonary disease. 7. Hypertension. 8. Remote history of a bleeding gastric ulcer. 9. Family history of cancer. Microsatellite instability testing returned with a high microsatellite genotype. 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 10. Right-sided abdominal pain surrounding the transverse incision, persistent. Question neuropathic pain related to surgery and oxaliplatin. 11. Acute neurotoxicity related to oxaliplatin manifested with leg weakness, ataxia, blurred vision following cycle 2 on 10/25/2010. The oxaliplatin was dose reduced by 50% with cycle 3 and diluted in a larger volume with administration over 4 hours. The acute neurotoxicity did not recur. 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 He has pain associated with the neuropathy. The neuropathy pain has improved with Neurontin 13. Surveillance colonoscopy 08/17/2011 by Dr. Ewing Schlein with 2 small benign polyps at the left colon. He underwent a colonoscopy in May 2013 with inflammation at the surgical anastomosis with an appearance of "ischemia ". There was diverticulosis and a colonoscopy was otherwise normal. 14. Right flank pain and dysuria 11/29/2011. Urinalysis was remarkable for too numerous to count red cells. Abdominal x-ray showed no renal calculi. He did not complain of flank pain or dysuria at today's visit. 15. Right breast mass and palpable right axillary lymph node. He is status post a an ultrasound-guided biopsy of the right breast on 01/03/2012 with the pathology confirming a fibroadenoma. . Followup mammogram negative on 06/26/2012. 16. Right abdominal pain May 2013. Question colitis.  He continues to have intermittent right abdominal pain 17. Mesenteric lymph nodes noted on a  CT 01/23/12-question reactive. No progressive lymphadenopathy on the CT 08/21/2012.   Disposition:  He remains in clinical remission from colon cancer. Mr. Pavek will return for an office visit and CEA in 6 months. He last underwent a colonoscopy in may of 2013. He will followup with Dr. Dulce Sellar for surveillance colonoscopies and consideration of an upper endoscopy in the setting of hereditary non-polyposis colon cancer. We obtained a urine cytology today.   Thornton Papas, MD  08/24/2012  2:34 PM

## 2013-02-22 ENCOUNTER — Encounter: Payer: Self-pay | Admitting: Nurse Practitioner

## 2013-02-22 ENCOUNTER — Telehealth: Payer: Self-pay | Admitting: Oncology

## 2013-02-22 ENCOUNTER — Ambulatory Visit (HOSPITAL_BASED_OUTPATIENT_CLINIC_OR_DEPARTMENT_OTHER): Payer: Medicare Other | Admitting: Nurse Practitioner

## 2013-02-22 ENCOUNTER — Other Ambulatory Visit: Payer: Medicare Other | Admitting: Lab

## 2013-02-22 VITALS — BP 128/80 | HR 73 | Temp 97.5°F | Resp 18 | Ht 71.0 in | Wt 211.3 lb

## 2013-02-22 DIAGNOSIS — C189 Malignant neoplasm of colon, unspecified: Secondary | ICD-10-CM

## 2013-02-22 DIAGNOSIS — R109 Unspecified abdominal pain: Secondary | ICD-10-CM

## 2013-02-22 DIAGNOSIS — G5790 Unspecified mononeuropathy of unspecified lower limb: Secondary | ICD-10-CM

## 2013-02-22 DIAGNOSIS — C183 Malignant neoplasm of hepatic flexure: Secondary | ICD-10-CM

## 2013-02-22 NOTE — Progress Notes (Signed)
OFFICE PROGRESS NOTE  Interval history:  Joe [name] returns as scheduled. He has stable neuropathy symptoms in the hands and feet. Neurontin helps the symptoms. He continues to have intermittent right-sided abdominal pain. Bowels overall moving regularly though he does note intermittent constipation. No bloody or black stools. He recently began experiencing intermittent sharp pain into the right testicle. He reports he is being evaluated for a hernia by his primary care provider. Due to headaches he recently underwent an EEG and CT scan both of which he reports were negative. The headaches are better. He has been referred to cardiology for evaluation of chest pain. He quit smoking earlier this week.   Objective: Blood pressure 128/80, pulse 73, temperature 97.5 F (36.4 C), temperature source Oral, resp. rate 18, height 5\' 11"  (1.803 m), weight 211 lb 4.8 oz (95.845 kg).  Oropharynx is without thrush. No palpable cervical, supraclavicular, axillary or left inguinal lymph nodes. Question tiny right inguinal lymph node. Expiratory wheezes right lung field. No respiratory distress. Regular cardiac rhythm. Abdomen is soft. No hepatomegaly. No mass. Tenderness surrounding the right abdominal scar. Extremities are without edema.  Lab Results: Lab Results  Component Value Date   WBC 4.5 08/21/2012   HGB 13.2 08/21/2012   HCT 38.2* 08/21/2012   MCV 93.2 08/21/2012   PLT 144 08/21/2012    Chemistry:    Chemistry      Component Value Date/Time   NA 139 08/21/2012 0754   NA 134* 05/31/2012 2349   NA 140 08/25/2011 0747   K 4.3 08/21/2012 0754   K 3.7 05/31/2012 2349   K 4.9* 08/25/2011 0747   CL 107 08/21/2012 0754   CL 98 05/31/2012 2349   CL 100 08/25/2011 0747   CO2 22 08/21/2012 0754   CO2 19 05/31/2012 2349   CO2 28 08/25/2011 0747   BUN 13.0 08/21/2012 0754   BUN 21 05/31/2012 2349   BUN 21 08/25/2011 0747   CREATININE 1.1 08/21/2012 0754   CREATININE 1.17 05/31/2012 2349   CREATININE 1.4*  08/25/2011 0747      Component Value Date/Time   CALCIUM 8.8 08/21/2012 0754   CALCIUM 9.6 05/31/2012 2349   CALCIUM 9.1 08/25/2011 0747   ALKPHOS 76 08/21/2012 0754   ALKPHOS 89 05/31/2012 2349   ALKPHOS 83 08/25/2011 0747   AST 41* 08/21/2012 0754   AST 123* 05/31/2012 2349   AST 51* 08/25/2011 0747   ALT 72* 08/21/2012 0754   ALT 159* 05/31/2012 2349   BILITOT 0.46 08/21/2012 0754   BILITOT 0.4 05/31/2012 2349   BILITOT 0.40 08/25/2011 0747       Studies/Results: No results found.  Medications: I have reviewed the patient's current medications.  Assessment/Plan:  1. Stage III (T3 N1) colon cancer status post right hemicolectomy 08/20/2010. 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 He began treatment with CAPOX on 09/24/2010. He completed cycle 8 beginning 02/18/2011. Restaging CTs of the chest, abdomen, and pelvis on 08/25/2011 showed no evidence of metastatic disease. 2. Staging CT of the chest 09/17/2010 negative for evidence of metastatic disease. 3. Anemia likely related to preoperative gastrointestinal bleeding: Resolved  4. History of alcohol use. 5. History of gout with an acute flare at the foot 11/26/2010. 6. Chronic obstructive pulmonary disease. 7. Hypertension. 8. Remote history of a bleeding gastric ulcer. 9. Family history of cancer. Microsatellite instability testing returned with a high microsatellite genotype. 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 10. Right-sided abdominal pain surrounding the transverse incision, persistent. Question neuropathic pain related to surgery and oxaliplatin. 11. Acute neurotoxicity related to oxaliplatin manifested with leg weakness, ataxia, blurred vision following cycle 2 on 10/25/2010. The oxaliplatin was dose reduced by 50% with cycle 3 and diluted in a larger volume with administration over 4 hours. The acute neurotoxicity did not recur. 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 He has pain associated with the neuropathy. The neuropathy pain has improved with Neurontin. 13. Surveillance colonoscopy 08/17/2011 by Dr. Ewing Schlein with 2 small benign polyps at the left colon. He underwent a colonoscopy in May 2013 with inflammation at the surgical anastomosis with an appearance of "ischemia ". There was diverticulosis and a colonoscopy was otherwise normal. 14. Right flank pain and dysuria 11/29/2011. Urinalysis was remarkable for too numerous to count red cells. Abdominal x-ray showed no renal calculi. He did not complain of flank pain or dysuria at today's visit. 15. Right breast mass and palpable right axillary lymph node. He is status post a an ultrasound-guided biopsy of the right breast on 01/03/2012 with the pathology confirming a fibroadenoma. Followup mammogram negative on 06/26/2012. 16. Right abdominal pain May 2013. Question colitis. He continues to have intermittent right abdominal pain 17. Mesenteric lymph nodes noted on a CT 01/23/12-question reactive. No progressive lymphadenopathy on the CT 08/21/2012. 18. Urine cytology 08/24/2012 with reactive urothelial cells present; acute inflammation.  Disposition-Joe [name] remains in clinical remission from colon cancer. He will return for a followup visit in 6 months with a CEA and CT scans of the chest/abdomen/pelvis several days prior to the office visit. We made a referral to Dr. Dulce Sellar regarding the timing of the next colonoscopy in this patient with Lynch syndrome.   Joe [name] will contact the office prior to his next visit with any problems.  Plan reviewed with Dr. Truett Perna.  Lonna Cobb ANP/GNP-BC

## 2013-02-22 NOTE — Telephone Encounter (Signed)
Gave pt appt for lab, Md for December 2014 gave pt oral contrast for Ct

## 2013-03-01 ENCOUNTER — Telehealth: Payer: Self-pay | Admitting: *Deleted

## 2013-03-01 NOTE — Telephone Encounter (Signed)
Message copied by Gala Romney on Thu Mar 01, 2013 11:49 AM ------      Message from: Ladene Artist      Created: Sun Feb 25, 2013  8:14 AM       Please call patient, cea is normal, f/u as scheduled ------

## 2013-03-01 NOTE — Telephone Encounter (Signed)
Pt notified of cea normal and confirmed appt for 08/27/13.

## 2013-03-07 ENCOUNTER — Telehealth: Payer: Self-pay | Admitting: *Deleted

## 2013-03-07 NOTE — Telephone Encounter (Signed)
Called to request MD dictate another letter for him regarding his neuropathy and inability to work. Is being made to be examined by social security physician on 7/29 and wants to take this letter with him. Requests it be mailed to his home address.

## 2013-03-08 ENCOUNTER — Telehealth: Payer: Self-pay | Admitting: Oncology

## 2013-03-08 NOTE — Telephone Encounter (Signed)
Pt will be seen by Dr. Hinda Lenis GI on 03/20/13 4:45pm ,Called pt, left message , gave hreferral to HIM to fax medical records

## 2013-03-12 ENCOUNTER — Telehealth: Payer: Self-pay | Admitting: Oncology

## 2013-03-12 NOTE — Telephone Encounter (Signed)
Faxed pt medical records to Dr. Outlaw °

## 2013-03-20 ENCOUNTER — Encounter (HOSPITAL_COMMUNITY): Payer: Self-pay | Admitting: Emergency Medicine

## 2013-03-20 ENCOUNTER — Inpatient Hospital Stay (HOSPITAL_COMMUNITY)
Admission: EM | Admit: 2013-03-20 | Discharge: 2013-03-24 | DRG: 392 | Disposition: A | Discharge status: Home / Self Care | Payer: Medicare Other | Attending: Internal Medicine | Admitting: Internal Medicine

## 2013-03-20 ENCOUNTER — Emergency Department (HOSPITAL_COMMUNITY): Payer: Medicare Other

## 2013-03-20 DIAGNOSIS — F3289 Other specified depressive episodes: Secondary | ICD-10-CM | POA: Diagnosis present

## 2013-03-20 DIAGNOSIS — K529 Noninfective gastroenteritis and colitis, unspecified: Secondary | ICD-10-CM

## 2013-03-20 DIAGNOSIS — F1010 Alcohol abuse, uncomplicated: Secondary | ICD-10-CM

## 2013-03-20 DIAGNOSIS — F329 Major depressive disorder, single episode, unspecified: Secondary | ICD-10-CM | POA: Diagnosis present

## 2013-03-20 DIAGNOSIS — K3184 Gastroparesis: Secondary | ICD-10-CM | POA: Diagnosis present

## 2013-03-20 DIAGNOSIS — K7689 Other specified diseases of liver: Secondary | ICD-10-CM | POA: Diagnosis present

## 2013-03-20 DIAGNOSIS — J45909 Unspecified asthma, uncomplicated: Secondary | ICD-10-CM | POA: Diagnosis present

## 2013-03-20 DIAGNOSIS — E785 Hyperlipidemia, unspecified: Secondary | ICD-10-CM | POA: Diagnosis present

## 2013-03-20 DIAGNOSIS — F1210 Cannabis abuse, uncomplicated: Secondary | ICD-10-CM | POA: Diagnosis present

## 2013-03-20 DIAGNOSIS — Z980 Intestinal bypass and anastomosis status: Secondary | ICD-10-CM

## 2013-03-20 DIAGNOSIS — K5730 Diverticulosis of large intestine without perforation or abscess without bleeding: Secondary | ICD-10-CM | POA: Diagnosis present

## 2013-03-20 DIAGNOSIS — F419 Anxiety disorder, unspecified: Secondary | ICD-10-CM

## 2013-03-20 DIAGNOSIS — R112 Nausea with vomiting, unspecified: Secondary | ICD-10-CM

## 2013-03-20 DIAGNOSIS — I10 Essential (primary) hypertension: Secondary | ICD-10-CM | POA: Diagnosis present

## 2013-03-20 DIAGNOSIS — J4489 Other specified chronic obstructive pulmonary disease: Secondary | ICD-10-CM | POA: Diagnosis present

## 2013-03-20 DIAGNOSIS — F411 Generalized anxiety disorder: Secondary | ICD-10-CM | POA: Diagnosis present

## 2013-03-20 DIAGNOSIS — Z87891 Personal history of nicotine dependence: Secondary | ICD-10-CM

## 2013-03-20 DIAGNOSIS — J449 Chronic obstructive pulmonary disease, unspecified: Secondary | ICD-10-CM | POA: Diagnosis present

## 2013-03-20 DIAGNOSIS — K219 Gastro-esophageal reflux disease without esophagitis: Secondary | ICD-10-CM | POA: Diagnosis present

## 2013-03-20 DIAGNOSIS — Z9049 Acquired absence of other specified parts of digestive tract: Secondary | ICD-10-CM

## 2013-03-20 DIAGNOSIS — F341 Dysthymic disorder: Secondary | ICD-10-CM

## 2013-03-20 DIAGNOSIS — R52 Pain, unspecified: Secondary | ICD-10-CM

## 2013-03-20 DIAGNOSIS — R1031 Right lower quadrant pain: Principal | ICD-10-CM | POA: Diagnosis present

## 2013-03-20 DIAGNOSIS — Z85038 Personal history of other malignant neoplasm of large intestine: Secondary | ICD-10-CM

## 2013-03-20 LAB — CBC WITH DIFFERENTIAL/PLATELET
Eosinophils Absolute: 0.1 10*3/uL (ref 0.0–0.7)
Eosinophils Relative: 2 % (ref 0–5)
HCT: 38.8 % — ABNORMAL LOW (ref 39.0–52.0)
Lymphocytes Relative: 23 % (ref 12–46)
Lymphs Abs: 1.5 10*3/uL (ref 0.7–4.0)
MCH: 31.3 pg (ref 26.0–34.0)
MCV: 90.7 fL (ref 78.0–100.0)
Monocytes Absolute: 0.5 10*3/uL (ref 0.1–1.0)
Platelets: 160 10*3/uL (ref 150–400)
RBC: 4.28 MIL/uL (ref 4.22–5.81)
WBC: 6.6 10*3/uL (ref 4.0–10.5)

## 2013-03-20 LAB — URINALYSIS, ROUTINE W REFLEX MICROSCOPIC
Bilirubin Urine: NEGATIVE
Glucose, UA: NEGATIVE mg/dL
Hgb urine dipstick: NEGATIVE
Ketones, ur: NEGATIVE mg/dL
Protein, ur: NEGATIVE mg/dL
Urobilinogen, UA: 0.2 mg/dL (ref 0.0–1.0)

## 2013-03-20 LAB — BASIC METABOLIC PANEL
BUN: 11 mg/dL (ref 6–23)
CO2: 26 mEq/L (ref 19–32)
Calcium: 9.4 mg/dL (ref 8.4–10.5)
Creatinine, Ser: 1.09 mg/dL (ref 0.50–1.35)
GFR calc non Af Amer: 77 mL/min — ABNORMAL LOW (ref >90)
Glucose, Bld: 114 mg/dL — ABNORMAL HIGH (ref 70–99)
Sodium: 139 mEq/L (ref 135–145)

## 2013-03-20 LAB — HEPATIC FUNCTION PANEL
AST: 36 U/L (ref 0–37)
Albumin: 3.7 g/dL (ref 3.5–5.2)

## 2013-03-20 LAB — URINE MICROSCOPIC-ADD ON

## 2013-03-20 MED ORDER — ONDANSETRON HCL 4 MG/2ML IJ SOLN
4.0000 mg | Freq: Once | INTRAMUSCULAR | Status: AC
  Administered 2013-03-20: 4 mg via INTRAVENOUS
  Filled 2013-03-20: qty 2

## 2013-03-20 MED ORDER — ONDANSETRON HCL 4 MG/2ML IJ SOLN
4.0000 mg | Freq: Four times a day (QID) | INTRAMUSCULAR | Status: DC | PRN
  Administered 2013-03-21: 4 mg via INTRAVENOUS
  Filled 2013-03-20 (×3): qty 2

## 2013-03-20 MED ORDER — SODIUM CHLORIDE 0.9 % IV BOLUS (SEPSIS)
1000.0000 mL | Freq: Once | INTRAVENOUS | Status: AC
  Administered 2013-03-20: 1000 mL via INTRAVENOUS

## 2013-03-20 MED ORDER — IOHEXOL 300 MG/ML  SOLN
50.0000 mL | Freq: Once | INTRAMUSCULAR | Status: AC | PRN
  Administered 2013-03-20: 50 mL via ORAL

## 2013-03-20 MED ORDER — ONDANSETRON HCL 4 MG PO TABS
4.0000 mg | ORAL_TABLET | Freq: Four times a day (QID) | ORAL | Status: DC | PRN
  Administered 2013-03-20: 4 mg via ORAL

## 2013-03-20 MED ORDER — IOHEXOL 300 MG/ML  SOLN
100.0000 mL | Freq: Once | INTRAMUSCULAR | Status: AC | PRN
  Administered 2013-03-20: 100 mL via INTRAVENOUS

## 2013-03-20 MED ORDER — HYDROMORPHONE HCL PF 1 MG/ML IJ SOLN
1.0000 mg | Freq: Once | INTRAMUSCULAR | Status: AC
  Administered 2013-03-20: 1 mg via INTRAVENOUS
  Filled 2013-03-20: qty 1

## 2013-03-20 MED ORDER — CITALOPRAM HYDROBROMIDE 40 MG PO TABS
40.0000 mg | ORAL_TABLET | Freq: Every day | ORAL | Status: DC
  Administered 2013-03-20 – 2013-03-24 (×5): 40 mg via ORAL
  Filled 2013-03-20 (×5): qty 1

## 2013-03-20 MED ORDER — MORPHINE SULFATE 2 MG/ML IJ SOLN
2.0000 mg | INTRAMUSCULAR | Status: DC | PRN
  Administered 2013-03-20 – 2013-03-24 (×5): 2 mg via INTRAVENOUS
  Filled 2013-03-20 (×6): qty 1

## 2013-03-20 MED ORDER — ALBUTEROL SULFATE HFA 108 (90 BASE) MCG/ACT IN AERS
2.0000 | INHALATION_SPRAY | Freq: Four times a day (QID) | RESPIRATORY_TRACT | Status: DC | PRN
  Filled 2013-03-20: qty 6.7

## 2013-03-20 MED ORDER — HYDROMORPHONE HCL PF 1 MG/ML IJ SOLN
1.0000 mg | INTRAMUSCULAR | Status: DC | PRN
  Administered 2013-03-21 – 2013-03-23 (×18): 1 mg via INTRAVENOUS
  Filled 2013-03-20 (×19): qty 1

## 2013-03-20 MED ORDER — OXYCODONE-ACETAMINOPHEN 5-325 MG PO TABS
1.0000 | ORAL_TABLET | Freq: Four times a day (QID) | ORAL | Status: DC | PRN
  Administered 2013-03-20 – 2013-03-23 (×8): 1 via ORAL
  Filled 2013-03-20 (×8): qty 1

## 2013-03-20 MED ORDER — BUDESONIDE-FORMOTEROL FUMARATE 80-4.5 MCG/ACT IN AERO
2.0000 | INHALATION_SPRAY | Freq: Two times a day (BID) | RESPIRATORY_TRACT | Status: DC
  Administered 2013-03-20 – 2013-03-24 (×8): 2 via RESPIRATORY_TRACT
  Filled 2013-03-20 (×2): qty 6.9

## 2013-03-20 MED ORDER — GABAPENTIN 300 MG PO CAPS
600.0000 mg | ORAL_CAPSULE | Freq: Two times a day (BID) | ORAL | Status: DC
  Administered 2013-03-20 – 2013-03-24 (×8): 600 mg via ORAL
  Filled 2013-03-20 (×10): qty 2

## 2013-03-20 MED ORDER — ALBUTEROL SULFATE (5 MG/ML) 0.5% IN NEBU
2.5000 mg | INHALATION_SOLUTION | RESPIRATORY_TRACT | Status: DC | PRN
  Administered 2013-03-21 – 2013-03-22 (×2): 2.5 mg via RESPIRATORY_TRACT
  Filled 2013-03-20 (×3): qty 0.5

## 2013-03-20 MED ORDER — SODIUM CHLORIDE 0.9 % IV SOLN
INTRAVENOUS | Status: AC
  Administered 2013-03-20: 22:00:00 via INTRAVENOUS

## 2013-03-20 MED ORDER — SODIUM CHLORIDE 0.9 % IJ SOLN
3.0000 mL | Freq: Two times a day (BID) | INTRAMUSCULAR | Status: DC
  Administered 2013-03-20 – 2013-03-23 (×3): 3 mL via INTRAVENOUS

## 2013-03-20 MED ORDER — ALLOPURINOL 300 MG PO TABS
300.0000 mg | ORAL_TABLET | Freq: Every day | ORAL | Status: DC
  Administered 2013-03-20 – 2013-03-24 (×5): 300 mg via ORAL
  Filled 2013-03-20 (×5): qty 1

## 2013-03-20 MED ORDER — PANTOPRAZOLE SODIUM 20 MG PO TBEC
20.0000 mg | DELAYED_RELEASE_TABLET | Freq: Every day | ORAL | Status: DC
  Administered 2013-03-21 – 2013-03-24 (×4): 20 mg via ORAL
  Filled 2013-03-20 (×4): qty 1

## 2013-03-20 NOTE — H&P (Signed)
PCP:   Deloris Ping, MD   Chief Complaint:  abd pain x 3 days  HPI: 52 yo male h/o colon cancer s/p bowel resection, htn comes in with 3 days of worsening rlq abd pain associated with n/v which is nonbloody.  Denies fevers.  Did have a loose nonbloody bm this am, is passing gas.  He has a scar at his rlq and the pain is located under the scar tissue.  Was seen today by dr outlaw as outpatient and sent to ED for severity of his pain.  Pt states that he has actually had pain here for over 2 years ever since his colon surgery, however since Sunday its markedly worse.  Was seen in [facility] ED on Sunday and did have ct scan that did not show any acute findings per report.  Pt still has his appendix.  Review of Systems:  Positive and negative as per HPI otherwise all other systems are negative  Past Medical History: Past Medical History  Diagnosis Date  . Hyperlipidemia   . Hypertension   . GERD (gastroesophageal reflux disease)   . COPD (chronic obstructive pulmonary disease)   . Cancer     colon  . Depression   . Anxiety   . H/O colon cancer, stage III 08/20/2010   Past Surgical History  Procedure Laterality Date  . Colon surgery  08/2010    colon cancer  . Endoscopic insertion peritoneal catheter port      placement  . Esophagogastroduodenoscopy      ulcers  . Colonoscopy  08/17/2011    Procedure: COLONOSCOPY;  Surgeon: Petra Kuba, MD;  Location: [facility];  Service: Endoscopy;  Laterality: N/A;  . Esophagogastroduodenoscopy  01/28/2012    Procedure: ESOPHAGOGASTRODUODENOSCOPY (EGD);  Surgeon: Barrie Folk, MD;  Location: [facility];  Service: Endoscopy;  Laterality: N/A;  . Colonoscopy  01/31/2012    Procedure: COLONOSCOPY;  Surgeon: Willis Modena, MD;  Location: [facility];  Service: Endoscopy;  Laterality: N/A;    Medications: Prior to Admission medications   Medication Sig Start Date End Date Taking? Authorizing Provider  albuterol (PROVENTIL HFA;VENTOLIN  HFA) 108 (90 BASE) MCG/ACT inhaler Inhale 2 puffs into the lungs every 6 (six) hours as needed. Wheezing or shortness of breath.   Yes Historical Provider, MD  allopurinol (ZYLOPRIM) 300 MG tablet Take 300 mg by mouth daily.     Yes Historical Provider, MD  amitriptyline (ELAVIL) 25 MG tablet Take 25 mg by mouth at bedtime.   Yes Historical Provider, MD  aspirin 81 MG tablet Take 81 mg by mouth daily.   Yes Historical Provider, MD  budesonide-formoterol (SYMBICORT) 80-4.5 MCG/ACT inhaler Inhale 2 puffs into the lungs 2 (two) times daily.     Yes Historical Provider, MD  citalopram (CELEXA) 40 MG tablet Take 40 mg by mouth daily.    Yes Historical Provider, MD  diazepam (VALIUM) 10 MG tablet Take 5 mg by mouth every 8 (eight) hours as needed.    Yes Historical Provider, MD  docusate sodium (COLACE) 100 MG capsule Take 100 mg by mouth 2 (two) times daily.   Yes Historical Provider, MD  gabapentin (NEURONTIN) 300 MG capsule Take 600 mg by mouth 2 (two) times daily. 11/02/11  Yes Ladene Artist, MD  lisinopril-hydrochlorothiazide (PRINZIDE,ZESTORETIC) 20-25 MG per tablet Take 1 tablet by mouth daily.   Yes Historical Provider, MD  oxyCODONE-acetaminophen (PERCOCET/ROXICET) 5-325 MG per tablet Take 1 tablet by mouth every 4 (four) hours as needed for pain.  Yes Historical Provider, MD  pantoprazole (PROTONIX) 20 MG tablet Take 20 mg by mouth daily.   Yes Historical Provider, MD    Allergies:  No Known Allergies  Social History:  reports that he has quit smoking. His smoking use included Cigarettes. He smoked 1.00 pack per day. He has never used smokeless tobacco. He reports that he drinks about 16.8 ounces of alcohol per week. He reports that he uses illicit drugs (Marijuana).  Family History: Family History  Problem Relation Age of Onset  . Anesthesia problems Neg Hx   . Hypotension Neg Hx   . Malignant hyperthermia Neg Hx   . Pseudochol deficiency Neg Hx     Physical Exam: Filed Vitals:    03/20/13 1452 03/20/13 1951  BP: 163/89 141/79  Pulse: 57 69  Temp: 98 F (36.7 C) 97.9 F (36.6 C)  TempSrc: Oral Oral  Resp: 17 20  SpO2: 96% 96%   General appearance: alert, cooperative and mild distress Head: Normocephalic, without obvious abnormality, atraumatic Eyes: negative Nose: Nares normal. Septum midline. Mucosa normal. No drainage or sinus tenderness. Neck: no JVD and supple, symmetrical, trachea midline Lungs: clear to auscultation bilaterally Heart: regular rate and rhythm, S1, S2 normal, no murmur, click, rub or gallop Abdomen: soft, mildly distended with good bs, ttp rlq no r/g no signs of peritonitis, nonacute exam Extremities: extremities normal, atraumatic, no cyanosis or edema Pulses: 2+ and symmetric Skin: Skin color, texture, turgor normal. No rashes or lesions Neurologic: Grossly normal    Labs on Admission:   Recent Labs  03/20/13 1510  NA 139  K 4.1  CL 103  CO2 26  GLUCOSE 114*  BUN 11  CREATININE 1.09  CALCIUM 9.4    Recent Labs  03/20/13 1510  LIPASE 25    Recent Labs  03/20/13 1510  WBC 6.6  NEUTROABS 4.5  HGB 13.4  HCT 38.8*  MCV 90.7  PLT 160   Radiological Exams on Admission: US Scrotum  03/20/2013   *RADIOLOGY REPORT*  Clinical Data:  Testicular and groin pain.  SCROTAL ULTRASOUND DOPPLER ULTRASOUND OF THE TESTICLES  Technique: Complete ultrasound examination of the testicles, epididymis, and other scrotal structures was performed.  Color and spectral Doppler ultrasound were also utilized to evaluate blood flow to the testicles.  Comparison:  None  Findings:  Right testis:  4.5 x 2.7 x 2.7 cm.  No focal mass.  Normal echotexture.  Left testis:  4.4 x 2.8 x 2.8 cm.  No focal mass.  Normal echotexture.  Right epididymis:  Normal in appearance.  Left epididymis:  Normal appearance.  Hydrocele:  Absent  Varicocele:  Absent  Pulsed Doppler interrogation of both testes demonstrates low resistance flow bilaterally.  IMPRESSION:  Normal testicular ultrasound. No evidence for mass or torsion.   Original Report Authenticated By: Norva Pavlov, M.D.   [place] Art/ven Flow Abd Pelv Doppler  03/20/2013   *RADIOLOGY REPORT*  Clinical Data:  Testicular and groin pain.  SCROTAL ULTRASOUND DOPPLER ULTRASOUND OF THE TESTICLES  Technique: Complete ultrasound examination of the testicles, epididymis, and other scrotal structures was performed.  Color and spectral Doppler ultrasound were also utilized to evaluate blood flow to the testicles.  Comparison:  None  Findings:  Right testis:  4.5 x 2.7 x 2.7 cm.  No focal mass.  Normal echotexture.  Left testis:  4.4 x 2.8 x 2.8 cm.  No focal mass.  Normal echotexture.  Right epididymis:  Normal in appearance.  Left epididymis:  Normal appearance.  Hydrocele:  Absent  Varicocele:  Absent  Pulsed Doppler interrogation of both testes demonstrates low resistance flow bilaterally.  IMPRESSION: Normal testicular ultrasound. No evidence for mass or torsion.   Original Report Authenticated By: Norva Pavlov, M.D.    Assessment/Plan 52 yo male with rlq abd pain for 3 days of unclear etiology  Principal Problem:   Abdominal pain, acute, right lower quadrant Active Problems:   Asthma   Anxiety and depression   H/O colon cancer, stage III   Nausea & vomiting  Ck ct scan.  abd distended.  Wbc normal and afebrile which is reassuring.  Ileus? Appendicitis ?vs pain from scar tissue or ?colitis.  Has not been placed on abx recently.  Conservative tx.  If ct scan abnormal, address the need for abx.  Keep npo for now.  Attempts to call [facility] oncall unsuccessful tonight, will need to retry in am.  nonsurgical at this time.  Tele, full code.  Detrell Umscheid A 03/20/2013, 8:30 PM

## 2013-03-20 NOTE — ED Notes (Signed)
Patient transported to CT 

## 2013-03-20 NOTE — Progress Notes (Signed)
Received report from [facility] ED

## 2013-03-20 NOTE — ED Provider Notes (Signed)
History    CSN: [id number] Arrival date & time 03/20/13  1438  First MD Initiated Contact with Patient 03/20/13 1514     Chief Complaint  Patient presents with  . Abdominal Pain   (Consider location/radiation/quality/duration/timing/severity/associated sxs/prior Treatment) The history is provided by the patient and the spouse. No language interpreter was used.  Joe [name] is 52 y/o M with PMhx of HLD, HTN, GERD, COPD, colon cancer s/p hemicolectomy 2011, anxiety, depression presenting to the ED with abdominal pain localized to the RLQ described as an intermittent sharp shooting pain that lasts approximately 10-15 minutes without radiation. Patient stated that it feels "deep inside my stomach." patient reported that the pain alleviates when laying flat, laying on his right side in a fetal position. Stated that the pain got worse of Sunday where he went to [facility] where blood work was performed as well as a CT - patient reported that he was told that nothing was found on the CT scan. Patient reported that he has been having right testicular and groin pain x 1 month, reported intermittent sharp shooting pain that lasts a couple of minutes. Patient reported feeling intermittent nausea, diarrhea 2 times in the last couple of days, decreased appetite. Patient was seen at [facility] and instructed to come to the ED by Dr. Willis Modena. Denied chest pain, shortness of breath, difficulty breathing, fever, chills, neck pain, back pain, hematochezia, melena, urinary symptoms, cough, congestion, blurred vision, changes to vision.  Patient reported that he had a similar presentation in May 2013.  PCP: Pricilla Holm Oncologist: Dr. Shawn Stall   Past Medical History  Diagnosis Date  . Hyperlipidemia   . Hypertension   . GERD (gastroesophageal reflux disease)   . COPD (chronic obstructive pulmonary disease)   . Cancer     colon  . Depression   . Anxiety   . H/O colon  cancer, stage III 08/20/2010   Past Surgical History  Procedure Laterality Date  . Colon surgery  08/2010    colon cancer  . Endoscopic insertion peritoneal catheter port      placement  . Esophagogastroduodenoscopy      ulcers  . Colonoscopy  08/17/2011    Procedure: COLONOSCOPY;  Surgeon: Petra Kuba, MD;  Location: [facility];  Service: Endoscopy;  Laterality: N/A;  . Esophagogastroduodenoscopy  01/28/2012    Procedure: ESOPHAGOGASTRODUODENOSCOPY (EGD);  Surgeon: Barrie Folk, MD;  Location: [facility];  Service: Endoscopy;  Laterality: N/A;  . Colonoscopy  01/31/2012    Procedure: COLONOSCOPY;  Surgeon: Willis Modena, MD;  Location: [facility];  Service: Endoscopy;  Laterality: N/A;   Family History  Problem Relation Age of Onset  . Anesthesia problems Neg Hx   . Hypotension Neg Hx   . Malignant hyperthermia Neg Hx   . Pseudochol deficiency Neg Hx    History  Substance Use Topics  . Smoking status: Former Smoker -- 1.00 packs/day    Types: Cigarettes  . Smokeless tobacco: Never Used  . Alcohol Use: 16.8 oz/week    28 Cans of beer per week     Comment: weekly    Review of Systems  Constitutional: Negative for fever and chills.  HENT: Negative for sore throat, trouble swallowing, neck pain and neck stiffness.   Eyes: Negative for visual disturbance.  Respiratory: Negative for chest tightness and shortness of breath.   Cardiovascular: Negative for chest pain.  Gastrointestinal: Positive for nausea, vomiting, abdominal pain and diarrhea. Negative for blood in stool, abdominal  distention and anal bleeding.  Genitourinary: Negative for dysuria, decreased urine volume and difficulty urinating.  Musculoskeletal: Negative for back pain.  Neurological: Positive for headaches. Negative for weakness and numbness.  All other systems reviewed and are negative.    Allergies  Review of patient's allergies indicates no known allergies.  Home Medications   Current  Outpatient Rx  Name  Route  Sig  Dispense  Refill  . albuterol (PROVENTIL HFA;VENTOLIN HFA) 108 (90 BASE) MCG/ACT inhaler   Inhalation   Inhale 2 puffs into the lungs every 6 (six) hours as needed. Wheezing or shortness of breath.         . allopurinol (ZYLOPRIM) 300 MG tablet   Oral   Take 300 mg by mouth daily.           Marland Kitchen amitriptyline (ELAVIL) 25 MG tablet   Oral   Take 25 mg by mouth at bedtime.         Marland Kitchen aspirin 81 MG tablet   Oral   Take 81 mg by mouth daily.         . budesonide-formoterol (SYMBICORT) 80-4.5 MCG/ACT inhaler   Inhalation   Inhale 2 puffs into the lungs 2 (two) times daily.           . citalopram (CELEXA) 40 MG tablet   Oral   Take 40 mg by mouth daily.          . diazepam (VALIUM) 10 MG tablet   Oral   Take 5 mg by mouth every 8 (eight) hours as needed.          . docusate sodium (COLACE) 100 MG capsule   Oral   Take 100 mg by mouth 2 (two) times daily.         Marland Kitchen gabapentin (NEURONTIN) 300 MG capsule   Oral   Take 600 mg by mouth 2 (two) times daily.         Marland Kitchen lisinopril-hydrochlorothiazide (PRINZIDE,ZESTORETIC) 20-25 MG per tablet   Oral   Take 1 tablet by mouth daily.         Marland Kitchen oxyCODONE-acetaminophen (PERCOCET/ROXICET) 5-325 MG per tablet   Oral   Take 1 tablet by mouth every 4 (four) hours as needed for pain.         . pantoprazole (PROTONIX) 20 MG tablet   Oral   Take 20 mg by mouth daily.          BP 141/79  Pulse 69  Temp(Src) 97.9 F (36.6 C) (Oral)  Resp 20  SpO2 96% Physical Exam  Nursing note and vitals reviewed. Constitutional: He is oriented to person, place, and time. He appears well-developed and well-nourished. No distress.  HENT:  Head: Normocephalic and atraumatic.  Mouth/Throat: Oropharynx is clear and moist. No oropharyngeal exudate.  Eyes: Conjunctivae and EOM are normal. Pupils are equal, round, and reactive to light. Right eye exhibits no discharge. Left eye exhibits no discharge.    Neck: Normal range of motion. Neck supple.  Cardiovascular: Normal rate, regular rhythm and normal heart sounds.  Exam reveals no friction rub.   No murmur heard. Pulses:      Radial pulses are 2+ on the right side, and 2+ on the left side.       Dorsalis pedis pulses are 2+ on the right side, and 2+ on the left side.  Pulmonary/Chest: Effort normal. No respiratory distress. He has wheezes. He has no rales.  Patient has history of COPD on albuterol  Abdominal: Soft.  Bowel sounds are normal. He exhibits no distension. There is tenderness in the right lower quadrant. There is no rigidity, no rebound, no guarding, no CVA tenderness and no tenderness at McBurney's point. No hernia.    Tenderness upon palpation to the RLQ and right groin region Positive Psoas and Obtruator sign  Genitourinary: Penis normal.  Negative testicular swelling, erythematous, inflammation. Negative pain upon palpation. Mild discomfort upon palpation to the right groin region.   Lymphadenopathy:    He has no cervical adenopathy.  Neurological: He is alert and oriented to person, place, and time. He exhibits normal muscle tone. Coordination normal.  Skin: Skin is warm and dry. No rash noted. He is not diaphoretic. No erythema.  Psychiatric: He has a normal mood and affect. His behavior is normal. Thought content normal.    ED Course  Procedures (including critical care time)  5:00PM Paperwork from [facility] arrived and reviewed by me and Dr. Algis Downs. Judd Lien. CT abdomen and pelvis with IV contrast performed 03/18/2013: negative findings noted - no bowel obstruction, normal bowel wall thickening, negative findings for diverticulosis or diverticulitis. Negative free air, free fluid, or adenopathy. Liver, gallbladder, spleen, pancreas, adrenal glands, kidneys and bladder are unremarkable. Normal abdominal aorta and IVC.   6:48PM At bedside with patient and wife discussing labs. Abdominal pain still a  9/10 with 2 mg Dilaudid IV given.  7:15PM Spoke with Dr. Onalee Hua, Internal Medicine, gave report for admission - Dr. Onalee Hua stated that CT scan should be performed again to rule out for appendix infection - discussed with Dr. Onalee Hua that patient just received CT scan with IV contrast on Sunday, 03/18/2013, Dr. Onalee Hua continued to recommend CT scan of abdomen to be performed in order for patient to be admitted.    7:39PM Spoke with Caryn Bee from Radiology reading room, asked if okay to get CT scan with IV contrast within 48 hours from original - Caryn Bee reported that fine with normal kidney function.   Labs Reviewed  CBC WITH DIFFERENTIAL - Abnormal; Notable for the following:    HCT 38.8 (*)    All other components within normal limits  BASIC METABOLIC PANEL - Abnormal; Notable for the following:    Glucose, Bld 114 (*)    GFR calc non Af Amer 77 (*)    GFR calc Af Amer 89 (*)    All other components within normal limits  URINALYSIS, ROUTINE W REFLEX MICROSCOPIC - Abnormal; Notable for the following:    APPearance CLOUDY (*)    Leukocytes, UA SMALL (*)    All other components within normal limits  LIPASE, BLOOD  URINE MICROSCOPIC-ADD ON  HEPATIC FUNCTION PANEL   US Scrotum  03/20/2013   *RADIOLOGY REPORT*  Clinical Data:  Testicular and groin pain.  SCROTAL ULTRASOUND DOPPLER ULTRASOUND OF THE TESTICLES  Technique: Complete ultrasound examination of the testicles, epididymis, and other scrotal structures was performed.  Color and spectral Doppler ultrasound were also utilized to evaluate blood flow to the testicles.  Comparison:  None  Findings:  Right testis:  4.5 x 2.7 x 2.7 cm.  No focal mass.  Normal echotexture.  Left testis:  4.4 x 2.8 x 2.8 cm.  No focal mass.  Normal echotexture.  Right epididymis:  Normal in appearance.  Left epididymis:  Normal appearance.  Hydrocele:  Absent  Varicocele:  Absent  Pulsed Doppler interrogation of both testes demonstrates low resistance flow bilaterally.   IMPRESSION: Normal testicular ultrasound. No evidence for mass or torsion.  Original Report Authenticated By: Norva Pavlov, M.D.   Ct Abdomen Pelvis W Contrast  03/20/2013   *RADIOLOGY REPORT*  Clinical Data: Rule out appendicitis  CT ABDOMEN AND PELVIS WITH CONTRAST  Technique:  Multidetector CT imaging of the abdomen and pelvis was performed following the standard protocol during bolus administration of intravenous contrast.  Contrast: OMNIPAQUE IOHEXOL 300 MG/ML  SOLN, 50mL OMNIPAQUE IOHEXOL 300 MG/ML  SOLN  Comparison: None.  Findings: There is no pleural or pericardial effusion.  There is mild diffuse low attenuation throughout the liver parenchyma.  No focal liver abnormalities noted.  The gallbladder appears within normal limits.  No biliary dilatation.  Normal appearance of the pancreas.  The spleen is normal.  The adrenal glands are both normal.  Normal appearance of the right kidney.  The left kidney is also normal.  Urinary bladder is unremarkable.  Prostate gland and seminal vesicles are unremarkable.  Calcified atherosclerotic change involves the abdominal aorta.  No aneurysm.  No pelvic or inguinal adenopathy noted.  The stomach and the small bowel loops have a normal course and caliber.  No obstruction.  There are postoperative changes from a right hemicolectomy with enterocolonic anastomoses.  There are multiple distal colonic diverticula without acute inflammation.  No free fluid or abnormal fluid collections identified within the abdomen or pelvis.  Review of the visualized osseous structures is unremarkable.  There is mild multilevel degenerative disc disease identified within the lumbar spine.  IMPRESSION:  1.  No acute findings identified within the abdomen or pelvis. 2.  Status post right hemicolectomy with enterocolonic anastomoses. 3.  Fatty infiltration of the liver. 4.  No evidence for bowel obstruction or abscess formation.   Original Report Authenticated By: Signa Kell,  M.D.   [place] Art/ven Flow Abd Pelv Doppler  03/20/2013   *RADIOLOGY REPORT*  Clinical Data:  Testicular and groin pain.  SCROTAL ULTRASOUND DOPPLER ULTRASOUND OF THE TESTICLES  Technique: Complete ultrasound examination of the testicles, epididymis, and other scrotal structures was performed.  Color and spectral Doppler ultrasound were also utilized to evaluate blood flow to the testicles.  Comparison:  None  Findings:  Right testis:  4.5 x 2.7 x 2.7 cm.  No focal mass.  Normal echotexture.  Left testis:  4.4 x 2.8 x 2.8 cm.  No focal mass.  Normal echotexture.  Right epididymis:  Normal in appearance.  Left epididymis:  Normal appearance.  Hydrocele:  Absent  Varicocele:  Absent  Pulsed Doppler interrogation of both testes demonstrates low resistance flow bilaterally.  IMPRESSION: Normal testicular ultrasound. No evidence for mass or torsion.   Original Report Authenticated By: Norva Pavlov, M.D.   1. Nausea & vomiting   2. Abdominal pain, acute, right lower quadrant   3. Anxiety and depression   4. H/O colon cancer, stage III   5. HTN (hypertension)   6. Asthma, unspecified asthma severity, uncomplicated     MDM  Patient seen in primary care doctor's office and instructed to come to the ED regarding abdominal pain that has been ongoing since Friday, localized to the RLQ. Patient has history of hemicolectomy performed in 2011. Patient reported that pain is similar presentation to abdominal pain that occurred in May 2013 - reviewed patient's charts - CT abdomen performed where focal mild colitis was diagnosed.  CT abdomen and pelvis performed on 03/18/2013 at [facility] in [place] - negative findings. UA negative findings. CBC negative findings. BMP negative findings. Lipase negative elevation. [place] no evidence of mass or torsion -  normal ultrasound. Hepatic function panel negative findings. CT abdomen and pelvis noted fatty infiltration of the liver - negative acute findings on the abdomen or  pelvis - no evidence of obstruction of abscess. Patient to be admitted to Internal Medicine, Dr. Vania Rea - admitting physician to Telemetry.   Raymon Mutton, PA-C 03/21/13 [phone]

## 2013-03-20 NOTE — ED Notes (Signed)
Pt. Is aware of giving a urine specimen. Pt. Unable to urinate at this time. Nurse was notified.

## 2013-03-20 NOTE — ED Notes (Signed)
Pt c/o lower right abdominal pain since Friday. States he had slight diarrhea aroubd 0500 and nauseous off and on but no vomiting.

## 2013-03-21 ENCOUNTER — Inpatient Hospital Stay (HOSPITAL_COMMUNITY): Payer: Medicare Other

## 2013-03-21 DIAGNOSIS — F1010 Alcohol abuse, uncomplicated: Secondary | ICD-10-CM

## 2013-03-21 LAB — RAPID URINE DRUG SCREEN, HOSP PERFORMED
Barbiturates: NOT DETECTED
Benzodiazepines: POSITIVE — AB
Cocaine: NOT DETECTED
Tetrahydrocannabinol: NOT DETECTED

## 2013-03-21 LAB — BASIC METABOLIC PANEL
BUN: 11 mg/dL (ref 6–23)
CO2: 26 mEq/L (ref 19–32)
Calcium: 8.8 mg/dL (ref 8.4–10.5)
Chloride: 102 mEq/L (ref 96–112)
Glucose, Bld: 119 mg/dL — ABNORMAL HIGH (ref 70–99)

## 2013-03-21 LAB — CBC
Hemoglobin: 12.2 g/dL — ABNORMAL LOW (ref 13.0–17.0)
MCH: 30.7 pg (ref 26.0–34.0)
MCV: 92.2 fL (ref 78.0–100.0)
RBC: 3.98 MIL/uL — ABNORMAL LOW (ref 4.22–5.81)

## 2013-03-21 NOTE — Consult Note (Signed)
[facility] CONSULT Reason for consult: Abdominal pain Referring Physician: [facility]. PCP: Dr Turner Daniels. Primary G.I.: Dr. Jerry Caras [name] is an 52 y.o. male.  HPI: the patient is admitted after seeing Dr. Dulce Sellar in the office yesterday for excruciating abdominal pain. The patient had right hemicolectomy by Dr. Daphine Deutscher about 4 years ago. He had subsequent chemotherapy. Ever since that time he has had persistent abdominal pain and has been on Percocet nearly consistent. The cause of his pain has been elusive and it has been suggested that it could have been neuropathy from his chemotherapy, adhesions, psychological, ischemic etc. He has had EGD showing retained food material and probable gastroparesis questionably due to medications or to partial obstruction. He has had CT scan showing some haziness round anastomosis but is not had anything clinically obvious on colonoscopies. He notes that for the past 3 to 4 days he has had progressive increase in the severity and his pain . He has not passed air,  felt bloated but did have small bowel movement 2 days ago. His labs here in the hospital, CT of the abdomen and pelvis, ultrasound of the right upper quadrant has failed to reveal any cause of his pain.  Past Medical History  Diagnosis Date  . Hyperlipidemia   . Hypertension   . GERD (gastroesophageal reflux disease)   . COPD (chronic obstructive pulmonary disease)   . Cancer     colon  . Depression   . Anxiety   . H/O colon cancer, stage III 08/20/2010    Past Surgical History  Procedure Laterality Date  . Colon surgery  08/2010    colon cancer  . Endoscopic insertion peritoneal catheter port      placement  . Esophagogastroduodenoscopy      ulcers  . Colonoscopy  08/17/2011    Procedure: COLONOSCOPY;  Surgeon: Petra Kuba, MD;  Location: [facility];  Service: Endoscopy;  Laterality: N/A;  . Esophagogastroduodenoscopy  01/28/2012    Procedure:  ESOPHAGOGASTRODUODENOSCOPY (EGD);  Surgeon: Barrie Folk, MD;  Location: [facility];  Service: Endoscopy;  Laterality: N/A;  . Colonoscopy  01/31/2012    Procedure: COLONOSCOPY;  Surgeon: Willis Modena, MD;  Location: [facility];  Service: Endoscopy;  Laterality: N/A;    Family History  Problem Relation Age of Onset  . Anesthesia problems Neg Hx   . Hypotension Neg Hx   . Malignant hyperthermia Neg Hx   . Pseudochol deficiency Neg Hx     Social History:  reports that he has quit smoking. His smoking use included Cigarettes. He smoked 1.00 pack per day. He has never used smokeless tobacco. He reports that he drinks about 16.8 ounces of alcohol per week. He reports that he uses illicit drugs (Marijuana).  Allergies: No Known Allergies  Medications; . allopurinol  300 mg Oral Daily  . budesonide-formoterol  2 puff Inhalation BID  . citalopram  40 mg Oral Daily  . gabapentin  600 mg Oral BID  . pantoprazole  20 mg Oral Daily  . sodium chloride  3 mL Intravenous Q12H   PRN Meds albuterol, albuterol, HYDROmorphone (DILAUDID) injection, morphine injection, ondansetron (ZOFRAN) IV, ondansetron, oxyCODONE-acetaminophen Results for orders placed during the hospital encounter of 03/20/13 (from the past 48 hour(s))  CBC WITH DIFFERENTIAL     Status: Abnormal   Collection Time    03/20/13  3:10 PM      Result Value Range   WBC 6.6  4.0 - 10.5 K/uL   RBC 4.28  4.22 - 5.81 MIL/uL   Hemoglobin 13.4  13.0 - 17.0 g/dL   HCT 40.9 (*) 81.1 - 91.4 %   MCV 90.7  78.0 - 100.0 fL   MCH 31.3  26.0 - 34.0 pg   MCHC 34.5  30.0 - 36.0 g/dL   RDW 78.2  95.6 - 21.3 %   Platelets 160  150 - 400 K/uL   Neutrophils Relative % 67  43 - 77 %   Neutro Abs 4.5  1.7 - 7.7 K/uL   Lymphocytes Relative 23  12 - 46 %   Lymphs Abs 1.5  0.7 - 4.0 K/uL   Monocytes Relative 8  3 - 12 %   Monocytes Absolute 0.5  0.1 - 1.0 K/uL   Eosinophils Relative 2  0 - 5 %   Eosinophils Absolute 0.1  0.0 - 0.7 K/uL    Basophils Relative 0  0 - 1 %   Basophils Absolute 0.0  0.0 - 0.1 K/uL  BASIC METABOLIC PANEL     Status: Abnormal   Collection Time    03/20/13  3:10 PM      Result Value Range   Sodium 139  135 - 145 mEq/L   Potassium 4.1  3.5 - 5.1 mEq/L   Chloride 103  96 - 112 mEq/L   CO2 26  19 - 32 mEq/L   Glucose, Bld 114 (*) 70 - 99 mg/dL   BUN 11  6 - 23 mg/dL   Creatinine, Ser 0.86  0.50 - 1.35 mg/dL   Calcium 9.4  8.4 - 57.8 mg/dL   GFR calc non Af Amer 77 (*) >90 mL/min   GFR calc Af Amer 89 (*) >90 mL/min   Comment:            The eGFR has been calculated     using the CKD EPI equation.     This calculation has not been     validated in all clinical     situations.     eGFR's persistently     <90 mL/min signify     possible Chronic Kidney Disease.  LIPASE, BLOOD     Status: None   Collection Time    03/20/13  3:10 PM      Result Value Range   Lipase 25  11 - 59 U/L  HEPATIC FUNCTION PANEL     Status: None   Collection Time    03/20/13  3:10 PM      Result Value Range   Total Protein 6.7  6.0 - 8.3 g/dL   Albumin 3.7  3.5 - 5.2 g/dL   AST 36  0 - 37 U/L   ALT 51  0 - 53 U/L   Alkaline Phosphatase 69  39 - 117 U/L   Total Bilirubin 0.3  0.3 - 1.2 mg/dL   Bilirubin, Direct <4.6  0.0 - 0.3 mg/dL   Indirect Bilirubin NOT CALCULATED  0.3 - 0.9 mg/dL  URINALYSIS, ROUTINE W REFLEX MICROSCOPIC     Status: Abnormal   Collection Time    03/20/13  3:17 PM      Result Value Range   Color, Urine YELLOW  YELLOW   APPearance CLOUDY (*) CLEAR   Specific Gravity, Urine 1.025  1.005 - 1.030   pH 5.0  5.0 - 8.0   Glucose, UA NEGATIVE  NEGATIVE mg/dL   Hgb urine dipstick NEGATIVE  NEGATIVE   Bilirubin Urine NEGATIVE  NEGATIVE   Ketones, ur NEGATIVE  NEGATIVE  mg/dL   Protein, ur NEGATIVE  NEGATIVE mg/dL   Urobilinogen, UA 0.2  0.0 - 1.0 mg/dL   Nitrite NEGATIVE  NEGATIVE   Leukocytes, UA SMALL (*) NEGATIVE  URINE MICROSCOPIC-ADD ON     Status: None   Collection Time    03/20/13   3:17 PM      Result Value Range   Squamous Epithelial / LPF RARE  RARE   WBC, UA 3-6  <3 WBC/hpf  BASIC METABOLIC PANEL     Status: Abnormal   Collection Time    03/21/13  5:20 AM      Result Value Range   Sodium 138  135 - 145 mEq/L   Potassium 3.8  3.5 - 5.1 mEq/L   Chloride 102  96 - 112 mEq/L   CO2 26  19 - 32 mEq/L   Glucose, Bld 119 (*) 70 - 99 mg/dL   BUN 11  6 - 23 mg/dL   Creatinine, Ser 1.61  0.50 - 1.35 mg/dL   Calcium 8.8  8.4 - 09.6 mg/dL   GFR calc non Af Amer >90  >90 mL/min   GFR calc Af Amer >90  >90 mL/min   Comment:            The eGFR has been calculated     using the CKD EPI equation.     This calculation has not been     validated in all clinical     situations.     eGFR's persistently     <90 mL/min signify     possible Chronic Kidney Disease.  CBC     Status: Abnormal   Collection Time    03/21/13  5:20 AM      Result Value Range   WBC 6.3  4.0 - 10.5 K/uL   RBC 3.98 (*) 4.22 - 5.81 MIL/uL   Hemoglobin 12.2 (*) 13.0 - 17.0 g/dL   HCT 04.5 (*) 40.9 - 81.1 %   MCV 92.2  78.0 - 100.0 fL   MCH 30.7  26.0 - 34.0 pg   MCHC 33.2  30.0 - 36.0 g/dL   RDW 91.4  78.2 - 95.6 %   Platelets 144 (*) 150 - 400 K/uL  PROTIME-INR     Status: None   Collection Time    03/21/13  5:20 AM      Result Value Range   Prothrombin Time 13.4  11.6 - 15.2 seconds   INR 1.04  0.00 - 1.49    US Abdomen Complete  03/21/2013   *RADIOLOGY REPORT*  Clinical Data:  The right lower quadrant abdominal pain, history of carcinoma of the colon  COMPLETE ABDOMINAL ULTRASOUND  Comparison:  CT abdomen pelvis of 03/20/2013  Findings:  Gallbladder:  The gallbladder is visualized and no gallstones are noted.  There is no pain over the gallbladder with compression.  Common bile duct:  The common bile duct is normal measuring 3.3 mm in diameter.  Liver:  The liver is diffusely echogenic consistent with fatty infiltration.  No focal abnormality is seen.  IVC:  Appears normal.  Pancreas:  No  focal abnormality seen.  Spleen:  The spleen is normal in size measuring 11.1 cm sagittally.  Right Kidney:  No hydronephrosis is seen.  The right kidney measures 11.4 cm sagittally.  Left Kidney:  No hydronephrosis is noted.  The left kidney measures 12.1 cm.  Abdominal aorta:  The abdominal aorta is normal in caliber.  IMPRESSION:  1.  Echogenic liver  consistent with diffuse fatty infiltration.  No focal abnormality. 2.  No gallstones.   Original Report Authenticated By: Dwyane Dee, M.D.   US Scrotum  03/20/2013   *RADIOLOGY REPORT*  Clinical Data:  Testicular and groin pain.  SCROTAL ULTRASOUND DOPPLER ULTRASOUND OF THE TESTICLES  Technique: Complete ultrasound examination of the testicles, epididymis, and other scrotal structures was performed.  Color and spectral Doppler ultrasound were also utilized to evaluate blood flow to the testicles.  Comparison:  None  Findings:  Right testis:  4.5 x 2.7 x 2.7 cm.  No focal mass.  Normal echotexture.  Left testis:  4.4 x 2.8 x 2.8 cm.  No focal mass.  Normal echotexture.  Right epididymis:  Normal in appearance.  Left epididymis:  Normal appearance.  Hydrocele:  Absent  Varicocele:  Absent  Pulsed Doppler interrogation of both testes demonstrates low resistance flow bilaterally.  IMPRESSION: Normal testicular ultrasound. No evidence for mass or torsion.   Original Report Authenticated By: Norva Pavlov, M.D.   Ct Abdomen Pelvis W Contrast  03/20/2013   *RADIOLOGY REPORT*  Clinical Data: Rule out appendicitis  CT ABDOMEN AND PELVIS WITH CONTRAST  Technique:  Multidetector CT imaging of the abdomen and pelvis was performed following the standard protocol during bolus administration of intravenous contrast.  Contrast: OMNIPAQUE IOHEXOL 300 MG/ML  SOLN, 50mL OMNIPAQUE IOHEXOL 300 MG/ML  SOLN  Comparison: None.  Findings: There is no pleural or pericardial effusion.  There is mild diffuse low attenuation throughout the liver parenchyma.  No focal liver  abnormalities noted.  The gallbladder appears within normal limits.  No biliary dilatation.  Normal appearance of the pancreas.  The spleen is normal.  The adrenal glands are both normal.  Normal appearance of the right kidney.  The left kidney is also normal.  Urinary bladder is unremarkable.  Prostate gland and seminal vesicles are unremarkable.  Calcified atherosclerotic change involves the abdominal aorta.  No aneurysm.  No pelvic or inguinal adenopathy noted.  The stomach and the small bowel loops have a normal course and caliber.  No obstruction.  There are postoperative changes from a right hemicolectomy with enterocolonic anastomoses.  There are multiple distal colonic diverticula without acute inflammation.  No free fluid or abnormal fluid collections identified within the abdomen or pelvis.  Review of the visualized osseous structures is unremarkable.  There is mild multilevel degenerative disc disease identified within the lumbar spine.  IMPRESSION:  1.  No acute findings identified within the abdomen or pelvis. 2.  Status post right hemicolectomy with enterocolonic anastomoses. 3.  Fatty infiltration of the liver. 4.  No evidence for bowel obstruction or abscess formation.   Original Report Authenticated By: Signa Kell, M.D.   [place] Art/ven Flow Abd Pelv Doppler  03/20/2013   *RADIOLOGY REPORT*  Clinical Data:  Testicular and groin pain.  SCROTAL ULTRASOUND DOPPLER ULTRASOUND OF THE TESTICLES  Technique: Complete ultrasound examination of the testicles, epididymis, and other scrotal structures was performed.  Color and spectral Doppler ultrasound were also utilized to evaluate blood flow to the testicles.  Comparison:  None  Findings:  Right testis:  4.5 x 2.7 x 2.7 cm.  No focal mass.  Normal echotexture.  Left testis:  4.4 x 2.8 x 2.8 cm.  No focal mass.  Normal echotexture.  Right epididymis:  Normal in appearance.  Left epididymis:  Normal appearance.  Hydrocele:  Absent  Varicocele:  Absent   Pulsed Doppler interrogation of both testes demonstrates low resistance flow bilaterally.  IMPRESSION: Normal testicular ultrasound. No evidence for mass or torsion.   Original Report Authenticated By: Norva Pavlov, M.D.               Blood pressure 126/91, pulse 56, temperature 97.8 F (36.6 C), temperature source Oral, resp. rate 18, height 5\' 11"  (1.803 m), weight 101.152 kg (223 lb), SpO2 98.00%.  Physical exam:   General-- white male with multiple tattoos ambulating in the hall with his wife Heart-- normal Lungs--clear Abdomen-- somewhat distended with mild diffuse tenderness normal bowel sounds   Assessment: 1. Abdominal pain. Because of his pain is unclear. CT scan and ultrasound or normal. Labs are normal. This could be pain medication seeking behavior, ischemic, adhesions etc. 2. Hepatic steatosis 3. History of colon cancer status post right hemicolectomy with subsequent chemotherapy. Colonoscopies have been up to date  Plan: we'll go ahead and give him some enemas and start him on small amounts of clear liquids. If he improves it might be pertinent to go ahead with colonoscopy, small bowel series to look for partial obstruction etc. We will follow him with you.   Crista Nuon JR,Emila Steinhauser L 03/21/2013, 12:44 PM

## 2013-03-21 NOTE — Progress Notes (Signed)
Clinical Social Work Department BRIEF PSYCHOSOCIAL ASSESSMENT 03/21/2013  Patient:  Joe [name], Joe [name]     Account Number:  [id number]     Admit date:  03/20/2013  Clinical Social Worker:  Dennison Bulla  Date/Time:  03/21/2013 12:45 PM  Referred by:  Physician  Date Referred:  03/21/2013 Referred for  Advanced Directives   Other Referral:   Interview type:  Patient Other interview type:    PSYCHOSOCIAL DATA Living Status:  FAMILY Admitted from facility:   Level of care:   Primary support name:  Malachi Bonds Primary support relationship to patient:  SPOUSE Degree of support available:   Strong    CURRENT CONCERNS Current Concerns  Other - See comment   Other Concerns:   Patient was interested in [facility]    SOCIAL WORK ASSESSMENT / PLAN CSW met with patient and wife at bedside in order to discuss advanced directives. Patient reports that he is married and understands that wife would make decisions but wants his dtr listed as well.    CSW introduced myself and explained role. Patient agreeable to wife involvement during assessment. CSW provided packet and assisted patient in completing information. CSW explained the difference between HCPOA and durable POA. Patient voiced understanding and completed packet.    CSW explained that packet needed to be notarized in order to be valid. No visitors present on floor at this time. CSW agreeable to follow up at later time in order to complete documents.   Assessment/plan status:  Psychosocial Support/Ongoing Assessment of Needs Other assessment/ plan:   Information/referral to community resources:   Advanced directives    PATIENT'S/FAMILY'S RESPONSE TO PLAN OF CARE: Patient alert and oriented. Patient engaged throughout session and thanked CSW for time. Patient appreciative of assistance and agreeable for CSW to follow up at later time to notarize packet.

## 2013-03-21 NOTE — ED Provider Notes (Signed)
Medical screening examination/treatment/procedure(s) were performed by non-physician practitioner and as supervising physician I was immediately available for consultation/collaboration.  Jovonte Alvah Lagrow, MD 03/21/13 1759 

## 2013-03-21 NOTE — Progress Notes (Signed)
[facility] PROGRESS NOTE Interim History: 52 yo male h/o colon cancer s/p bowel resection, htn comes in with 3 days of worsening rlq abd pain associated with n/v which is nonbloody. Denies fevers. Did have a loose nonbloody bm this am, is passing gas. He has a scar at his rlq and the pain is located under the scar tissue, ct abdomen and pelvis: No acute findings identified within the abdomen or pelvis. Status post right hemicolectomy with enterocolonic anastomoses. Fatty infiltration of the liver   Assessment/Plan:  Nausea & vomiting/  *Abdominal pain, acute, right lower quadrant: - Ct abdomen and pelvis 7.8.2014 showed no acute finding. - LFT's with in normal limits. - No leukocytosis, or fevers. No tachycardic, not on empiric antibiotics. - scrotal US and arterial flow negative. - check abdominal U/s. - superficial pain on RLQ, tender to palpation and dysesthesia. ? Due to alcohol and tobacco. Check UDS. - continue NPO and conservative management.  Asthma - stable.   Anxiety and depression - stable.    H/O colon cancer, stage III - oncologist as an outpatient.    Code Status: full Family Communication: wife  Disposition Plan: inpatinet   Consultants:  GI  Procedures:  Ct abdomen  U/S  Antibiotics:  None  HPI/Subjective: intermittent pain. Not hungry last BM 7.9.2014  Objective: Filed Vitals:   03/20/13 1452 03/20/13 1951 03/20/13 2234 03/20/13 2242  BP: 163/89 141/79 148/81   Pulse: 57 69 52   Temp: 98 F (36.7 C) 97.9 F (36.6 C) 98.1 F (36.7 C)   TempSrc: Oral Oral Oral   Resp: 17 20 16    Height:    5\' 11"  (1.803 m)  Weight:    101.152 kg (223 lb)  SpO2: 96% 96% 100%    No intake or output data in the 24 hours ending 03/21/13 0731 Filed Weights   03/20/13 2242  Weight: 101.152 kg (223 lb)    Exam:  General: Alert, awake, oriented x3, in no acute distress.  HEENT: No bruits, no goiter.  Heart: Regular rate and rhythm, without  murmurs, rubs, gallops.  Lungs: Good air movement, clear to auscultation. Abdomen: Soft, tender in RLQ, dysesthesia. Neuro: Grossly intact, nonfocal.   Data Reviewed: Basic Metabolic Panel:  Recent Labs Lab 03/20/13 1510 03/21/13 0520  NA 139 138  K 4.1 3.8  CL 103 102  CO2 26 26  GLUCOSE 114* 119*  BUN 11 11  CREATININE 1.09 0.99  CALCIUM 9.4 8.8   Liver Function Tests:  Recent Labs Lab 03/20/13 1510  AST 36  ALT 51  ALKPHOS 69  BILITOT 0.3  PROT 6.7  ALBUMIN 3.7    Recent Labs Lab 03/20/13 1510  LIPASE 25   No results found for this basename: AMMONIA,  in the last 168 hours CBC:  Recent Labs Lab 03/20/13 1510 03/21/13 0520  WBC 6.6 6.3  NEUTROABS 4.5  --   HGB 13.4 12.2*  HCT 38.8* 36.7*  MCV 90.7 92.2  PLT 160 144*   Cardiac Enzymes: No results found for this basename: CKTOTAL, CKMB, CKMBINDEX, TROPONINI,  in the last 168 hours BNP (last 3 results) No results found for this basename: PROBNP,  in the last 8760 hours CBG: No results found for this basename: GLUCAP,  in the last 168 hours  No results found for this or any previous visit (from the past 240 hour(s)).   Studies: US Scrotum  03/20/2013   *RADIOLOGY REPORT*  Clinical Data:  Testicular and groin pain.  SCROTAL  ULTRASOUND DOPPLER ULTRASOUND OF THE TESTICLES  Technique: Complete ultrasound examination of the testicles, epididymis, and other scrotal structures was performed.  Color and spectral Doppler ultrasound were also utilized to evaluate blood flow to the testicles.  Comparison:  None  Findings:  Right testis:  4.5 x 2.7 x 2.7 cm.  No focal mass.  Normal echotexture.  Left testis:  4.4 x 2.8 x 2.8 cm.  No focal mass.  Normal echotexture.  Right epididymis:  Normal in appearance.  Left epididymis:  Normal appearance.  Hydrocele:  Absent  Varicocele:  Absent  Pulsed Doppler interrogation of both testes demonstrates low resistance flow bilaterally.  IMPRESSION: Normal testicular ultrasound.  No evidence for mass or torsion.   Original Report Authenticated By: Norva Pavlov, M.D.   Ct Abdomen Pelvis W Contrast  03/20/2013   *RADIOLOGY REPORT*  Clinical Data: Rule out appendicitis  CT ABDOMEN AND PELVIS WITH CONTRAST  Technique:  Multidetector CT imaging of the abdomen and pelvis was performed following the standard protocol during bolus administration of intravenous contrast.  Contrast: OMNIPAQUE IOHEXOL 300 MG/ML  SOLN, 50mL OMNIPAQUE IOHEXOL 300 MG/ML  SOLN  Comparison: None.  Findings: There is no pleural or pericardial effusion.  There is mild diffuse low attenuation throughout the liver parenchyma.  No focal liver abnormalities noted.  The gallbladder appears within normal limits.  No biliary dilatation.  Normal appearance of the pancreas.  The spleen is normal.  The adrenal glands are both normal.  Normal appearance of the right kidney.  The left kidney is also normal.  Urinary bladder is unremarkable.  Prostate gland and seminal vesicles are unremarkable.  Calcified atherosclerotic change involves the abdominal aorta.  No aneurysm.  No pelvic or inguinal adenopathy noted.  The stomach and the small bowel loops have a normal course and caliber.  No obstruction.  There are postoperative changes from a right hemicolectomy with enterocolonic anastomoses.  There are multiple distal colonic diverticula without acute inflammation.  No free fluid or abnormal fluid collections identified within the abdomen or pelvis.  Review of the visualized osseous structures is unremarkable.  There is mild multilevel degenerative disc disease identified within the lumbar spine.  IMPRESSION:  1.  No acute findings identified within the abdomen or pelvis. 2.  Status post right hemicolectomy with enterocolonic anastomoses. 3.  Fatty infiltration of the liver. 4.  No evidence for bowel obstruction or abscess formation.   Original Report Authenticated By: Signa Kell, M.D.   [place] Art/ven Flow Abd Pelv  Doppler  03/20/2013   *RADIOLOGY REPORT*  Clinical Data:  Testicular and groin pain.  SCROTAL ULTRASOUND DOPPLER ULTRASOUND OF THE TESTICLES  Technique: Complete ultrasound examination of the testicles, epididymis, and other scrotal structures was performed.  Color and spectral Doppler ultrasound were also utilized to evaluate blood flow to the testicles.  Comparison:  None  Findings:  Right testis:  4.5 x 2.7 x 2.7 cm.  No focal mass.  Normal echotexture.  Left testis:  4.4 x 2.8 x 2.8 cm.  No focal mass.  Normal echotexture.  Right epididymis:  Normal in appearance.  Left epididymis:  Normal appearance.  Hydrocele:  Absent  Varicocele:  Absent  Pulsed Doppler interrogation of both testes demonstrates low resistance flow bilaterally.  IMPRESSION: Normal testicular ultrasound. No evidence for mass or torsion.   Original Report Authenticated By: Norva Pavlov, M.D.    Scheduled Meds: . allopurinol  300 mg Oral Daily  . budesonide-formoterol  2 puff Inhalation BID  .  citalopram  40 mg Oral Daily  . gabapentin  600 mg Oral BID  . pantoprazole  20 mg Oral Daily  . sodium chloride  3 mL Intravenous Q12H   Continuous Infusions: . sodium chloride 100 mL/hr at 03/20/13 2222     Marinda Elk  [facility] Pager [phone]. If 8PM-8AM, please contact night-coverage at [email or web address], password [facility] 03/21/2013, 7:31 AM  LOS: 1 day

## 2013-03-21 NOTE — Progress Notes (Signed)
Clinical Social Work  Biochemist, clinical notarized. Copy placed in chart. Original and copy given to patient. CSW is signing off but available if needed.  [place], [place] [phone]

## 2013-03-22 MED ORDER — PEG 3350-KCL-NA BICARB-NACL 420 G PO SOLR
2000.0000 mL | Freq: Once | ORAL | Status: AC
  Administered 2013-03-23: 2000 mL via ORAL

## 2013-03-22 MED ORDER — PEG 3350-KCL-NA BICARB-NACL 420 G PO SOLR
2000.0000 mL | Freq: Once | ORAL | Status: AC
  Administered 2013-03-22: 2000 mL via ORAL

## 2013-03-22 MED ORDER — DIAZEPAM 5 MG PO TABS
5.0000 mg | ORAL_TABLET | Freq: Three times a day (TID) | ORAL | Status: DC | PRN
  Administered 2013-03-22 – 2013-03-23 (×4): 5 mg via ORAL
  Filled 2013-03-22 (×4): qty 1

## 2013-03-22 MED ORDER — DIPHENHYDRAMINE HCL 25 MG PO CAPS
25.0000 mg | ORAL_CAPSULE | Freq: Four times a day (QID) | ORAL | Status: DC | PRN
  Administered 2013-03-22: 25 mg via ORAL
  Filled 2013-03-22: qty 1

## 2013-03-22 MED ORDER — DIAZEPAM 5 MG PO TABS: 5.0000 mg | ORAL_TABLET | Freq: Three times a day (TID) | ORAL | Status: DC | PRN

## 2013-03-22 NOTE — Progress Notes (Signed)
Patient pacing the halls, staying awake and not sleeping, asking for pain medication, when asked patient stated "he's not sleepy", Waiting on next pain medication.  Attitude suggest that he is pill seeking, will continue to monitor

## 2013-03-22 NOTE — Progress Notes (Signed)
[facility] PROGRESS NOTE Interim History: 52 yo male h/o colon cancer s/p bowel resection, htn comes in with 3 days of worsening rlq abd pain associated with n/v which is nonbloody. Denies fevers. Did have a loose nonbloody bm this am, is passing gas. He has a scar at his rlq and the pain is located under the scar tissue, ct abdomen and pelvis: No acute findings identified within the abdomen or pelvis. Status post right hemicolectomy with enterocolonic anastomoses. Fatty infiltration of the liver   Assessment/Plan:  Nausea & vomiting/  *Abdominal pain, acute, right lower quadrant: - Walking around the hall and asking for pain meds. Will put him NPO possible colonoscopy. He is also asking for food and pain meds at the same time. - Ct abdomen and pelvis 7.8.2014 showed no acute finding. - LFT's with in normal limits. - scrotal US and arterial flow negative. - Abdominal U/s no cholecystitis - UDS bzd'a and narcotics.   Asthma - stable.   Anxiety and depression - stable.    H/O colon cancer, stage III - oncologist as an outpatient.    Code Status: full Family Communication: wife  Disposition Plan: inpatinet   Consultants:  GI  Procedures:  Ct abdomen  U/S  Antibiotics:  None  HPI/Subjective: Pain unchanged. -  Objective: Filed Vitals:   03/21/13 0600 03/21/13 1401 03/21/13 2200 03/22/13 0600  BP: 126/91 137/78 143/84 132/70  Pulse: 56 69 84 68  Temp: 97.8 F (36.6 C) 98 F (36.7 C) 98.2 F (36.8 C) 98 F (36.7 C)  TempSrc: Oral Oral Oral Oral  Resp: 18 18 18 18   Height:      Weight:      SpO2: 98% 95% 94% 96%    Intake/Output Summary (Last 24 hours) at 03/22/13 0855 Last data filed at 03/22/13 0800  Gross per 24 hour  Intake    840 ml  Output      0 ml  Net    840 ml   Filed Weights   03/20/13 2242  Weight: 101.152 kg (223 lb)    Exam:  General: Alert, awake, oriented x3, in no acute distress.  HEENT: No bruits, no goiter.  Heart:  Regular rate and rhythm, without murmurs, rubs, gallops.  Lungs: Good air movement, clear to auscultation. Abdomen: Soft, tender in RLQ, dysesthesia. Neuro: Grossly intact, nonfocal.   Data Reviewed: Basic Metabolic Panel:  Recent Labs Lab 03/20/13 1510 03/21/13 0520  NA 139 138  K 4.1 3.8  CL 103 102  CO2 26 26  GLUCOSE 114* 119*  BUN 11 11  CREATININE 1.09 0.99  CALCIUM 9.4 8.8   Liver Function Tests:  Recent Labs Lab 03/20/13 1510  AST 36  ALT 51  ALKPHOS 69  BILITOT 0.3  PROT 6.7  ALBUMIN 3.7    Recent Labs Lab 03/20/13 1510  LIPASE 25   No results found for this basename: AMMONIA,  in the last 168 hours CBC:  Recent Labs Lab 03/20/13 1510 03/21/13 0520  WBC 6.6 6.3  NEUTROABS 4.5  --   HGB 13.4 12.2*  HCT 38.8* 36.7*  MCV 90.7 92.2  PLT 160 144*   Cardiac Enzymes: No results found for this basename: CKTOTAL, CKMB, CKMBINDEX, TROPONINI,  in the last 168 hours BNP (last 3 results) No results found for this basename: PROBNP,  in the last 8760 hours CBG: No results found for this basename: GLUCAP,  in the last 168 hours  No results found for this or any previous  visit (from the past 240 hour(s)).   Studies: US Abdomen Complete  03/21/2013   *RADIOLOGY REPORT*  Clinical Data:  The right lower quadrant abdominal pain, history of carcinoma of the colon  COMPLETE ABDOMINAL ULTRASOUND  Comparison:  CT abdomen pelvis of 03/20/2013  Findings:  Gallbladder:  The gallbladder is visualized and no gallstones are noted.  There is no pain over the gallbladder with compression.  Common bile duct:  The common bile duct is normal measuring 3.3 mm in diameter.  Liver:  The liver is diffusely echogenic consistent with fatty infiltration.  No focal abnormality is seen.  IVC:  Appears normal.  Pancreas:  No focal abnormality seen.  Spleen:  The spleen is normal in size measuring 11.1 cm sagittally.  Right Kidney:  No hydronephrosis is seen.  The right kidney measures 11.4  cm sagittally.  Left Kidney:  No hydronephrosis is noted.  The left kidney measures 12.1 cm.  Abdominal aorta:  The abdominal aorta is normal in caliber.  IMPRESSION:  1.  Echogenic liver consistent with diffuse fatty infiltration.  No focal abnormality. 2.  No gallstones.   Original Report Authenticated By: Dwyane Dee, M.D.   US Scrotum  03/20/2013   *RADIOLOGY REPORT*  Clinical Data:  Testicular and groin pain.  SCROTAL ULTRASOUND DOPPLER ULTRASOUND OF THE TESTICLES  Technique: Complete ultrasound examination of the testicles, epididymis, and other scrotal structures was performed.  Color and spectral Doppler ultrasound were also utilized to evaluate blood flow to the testicles.  Comparison:  None  Findings:  Right testis:  4.5 x 2.7 x 2.7 cm.  No focal mass.  Normal echotexture.  Left testis:  4.4 x 2.8 x 2.8 cm.  No focal mass.  Normal echotexture.  Right epididymis:  Normal in appearance.  Left epididymis:  Normal appearance.  Hydrocele:  Absent  Varicocele:  Absent  Pulsed Doppler interrogation of both testes demonstrates low resistance flow bilaterally.  IMPRESSION: Normal testicular ultrasound. No evidence for mass or torsion.   Original Report Authenticated By: Norva Pavlov, M.D.   Ct Abdomen Pelvis W Contrast  03/20/2013   *RADIOLOGY REPORT*  Clinical Data: Rule out appendicitis  CT ABDOMEN AND PELVIS WITH CONTRAST  Technique:  Multidetector CT imaging of the abdomen and pelvis was performed following the standard protocol during bolus administration of intravenous contrast.  Contrast: OMNIPAQUE IOHEXOL 300 MG/ML  SOLN, 50mL OMNIPAQUE IOHEXOL 300 MG/ML  SOLN  Comparison: None.  Findings: There is no pleural or pericardial effusion.  There is mild diffuse low attenuation throughout the liver parenchyma.  No focal liver abnormalities noted.  The gallbladder appears within normal limits.  No biliary dilatation.  Normal appearance of the pancreas.  The spleen is normal.  The adrenal glands are  both normal.  Normal appearance of the right kidney.  The left kidney is also normal.  Urinary bladder is unremarkable.  Prostate gland and seminal vesicles are unremarkable.  Calcified atherosclerotic change involves the abdominal aorta.  No aneurysm.  No pelvic or inguinal adenopathy noted.  The stomach and the small bowel loops have a normal course and caliber.  No obstruction.  There are postoperative changes from a right hemicolectomy with enterocolonic anastomoses.  There are multiple distal colonic diverticula without acute inflammation.  No free fluid or abnormal fluid collections identified within the abdomen or pelvis.  Review of the visualized osseous structures is unremarkable.  There is mild multilevel degenerative disc disease identified within the lumbar spine.  IMPRESSION:  1.  No acute findings identified within the abdomen or pelvis. 2.  Status post right hemicolectomy with enterocolonic anastomoses. 3.  Fatty infiltration of the liver. 4.  No evidence for bowel obstruction or abscess formation.   Original Report Authenticated By: Signa Kell, M.D.   [place] Art/ven Flow Abd Pelv Doppler  03/20/2013   *RADIOLOGY REPORT*  Clinical Data:  Testicular and groin pain.  SCROTAL ULTRASOUND DOPPLER ULTRASOUND OF THE TESTICLES  Technique: Complete ultrasound examination of the testicles, epididymis, and other scrotal structures was performed.  Color and spectral Doppler ultrasound were also utilized to evaluate blood flow to the testicles.  Comparison:  None  Findings:  Right testis:  4.5 x 2.7 x 2.7 cm.  No focal mass.  Normal echotexture.  Left testis:  4.4 x 2.8 x 2.8 cm.  No focal mass.  Normal echotexture.  Right epididymis:  Normal in appearance.  Left epididymis:  Normal appearance.  Hydrocele:  Absent  Varicocele:  Absent  Pulsed Doppler interrogation of both testes demonstrates low resistance flow bilaterally.  IMPRESSION: Normal testicular ultrasound. No evidence for mass or torsion.   Original  Report Authenticated By: Norva Pavlov, M.D.    Scheduled Meds: . allopurinol  300 mg Oral Daily  . budesonide-formoterol  2 puff Inhalation BID  . citalopram  40 mg Oral Daily  . gabapentin  600 mg Oral BID  . pantoprazole  20 mg Oral Daily  . sodium chloride  3 mL Intravenous Q12H   Continuous Infusions:     Marinda Elk  [facility] Pager [phone]. If 8PM-8AM, please contact night-coverage at [email or web address], password [facility] 03/22/2013, 8:55 AM  LOS: 2 days

## 2013-03-22 NOTE — Progress Notes (Signed)
[facility] PROGRESS NOTE Subjective Patient still having some pain and bloating it better.  Objective: Vital signs in last 24 hours: Temp:  [98 F (36.7 C)-98.2 F (36.8 C)] 98 F (36.7 C) (07/10 0600) Pulse Rate:  [68-84] 68 (07/10 0600) Resp:  [18] 18 (07/10 0600) BP: (132-143)/(70-84) 132/70 mmHg (07/10 0600) SpO2:  [94 %-96 %] 96 % (07/10 0600) Last BM Date: 03/20/13  Intake/Output from previous day: 07/09 0701 - 07/10 0700 In: 720 [P.O.:720] Out: -  Intake/Output this shift: Total I/O In: 120 [P.O.:120] Out: -   PE: General--no acute distress Heart-- Lungs--clear Abdomen--slightly distended nontender with good bowel sounds  Lab Results:  Recent Labs  03/20/13 1510 03/21/13 0520  WBC 6.6 6.3  HGB 13.4 12.2*  HCT 38.8* 36.7*  PLT 160 144*   BMET  Recent Labs  03/20/13 1510 03/21/13 0520  NA 139 138  K 4.1 3.8  CL 103 102  CO2 26 26  CREATININE 1.09 0.99   LFT  Recent Labs  03/20/13 1510  PROT 6.7  AST 36  ALT 51  ALKPHOS 69  BILITOT 0.3  BILIDIR <0.1  IBILI NOT CALCULATED   PT/INR  Recent Labs  03/21/13 0520  LABPROT 13.4  INR 1.04   PANCREAS  Recent Labs  03/20/13 1510  LIPASE 25         Studies/Results: US Abdomen Complete  03/21/2013   *RADIOLOGY REPORT*  Clinical Data:  The right lower quadrant abdominal pain, history of carcinoma of the colon  COMPLETE ABDOMINAL ULTRASOUND  Comparison:  CT abdomen pelvis of 03/20/2013  Findings:  Gallbladder:  The gallbladder is visualized and no gallstones are noted.  There is no pain over the gallbladder with compression.  Common bile duct:  The common bile duct is normal measuring 3.3 mm in diameter.  Liver:  The liver is diffusely echogenic consistent with fatty infiltration.  No focal abnormality is seen.  IVC:  Appears normal.  Pancreas:  No focal abnormality seen.  Spleen:  The spleen is normal in size measuring 11.1 cm sagittally.  Right Kidney:  No hydronephrosis is  seen.  The right kidney measures 11.4 cm sagittally.  Left Kidney:  No hydronephrosis is noted.  The left kidney measures 12.1 cm.  Abdominal aorta:  The abdominal aorta is normal in caliber.  IMPRESSION:  1.  Echogenic liver consistent with diffuse fatty infiltration.  No focal abnormality. 2.  No gallstones.   Original Report Authenticated By: Dwyane Dee, M.D.   US Scrotum  03/20/2013   *RADIOLOGY REPORT*  Clinical Data:  Testicular and groin pain.  SCROTAL ULTRASOUND DOPPLER ULTRASOUND OF THE TESTICLES  Technique: Complete ultrasound examination of the testicles, epididymis, and other scrotal structures was performed.  Color and spectral Doppler ultrasound were also utilized to evaluate blood flow to the testicles.  Comparison:  None  Findings:  Right testis:  4.5 x 2.7 x 2.7 cm.  No focal mass.  Normal echotexture.  Left testis:  4.4 x 2.8 x 2.8 cm.  No focal mass.  Normal echotexture.  Right epididymis:  Normal in appearance.  Left epididymis:  Normal appearance.  Hydrocele:  Absent  Varicocele:  Absent  Pulsed Doppler interrogation of both testes demonstrates low resistance flow bilaterally.  IMPRESSION: Normal testicular ultrasound. No evidence for mass or torsion.   Original Report Authenticated By: Norva Pavlov, M.D.   Ct Abdomen Pelvis W Contrast  03/20/2013   *RADIOLOGY REPORT*  Clinical Data: Rule out appendicitis  CT ABDOMEN AND  PELVIS WITH CONTRAST  Technique:  Multidetector CT imaging of the abdomen and pelvis was performed following the standard protocol during bolus administration of intravenous contrast.  Contrast: OMNIPAQUE IOHEXOL 300 MG/ML  SOLN, 50mL OMNIPAQUE IOHEXOL 300 MG/ML  SOLN  Comparison: None.  Findings: There is no pleural or pericardial effusion.  There is mild diffuse low attenuation throughout the liver parenchyma.  No focal liver abnormalities noted.  The gallbladder appears within normal limits.  No biliary dilatation.  Normal appearance of the pancreas.  The spleen  is normal.  The adrenal glands are both normal.  Normal appearance of the right kidney.  The left kidney is also normal.  Urinary bladder is unremarkable.  Prostate gland and seminal vesicles are unremarkable.  Calcified atherosclerotic change involves the abdominal aorta.  No aneurysm.  No pelvic or inguinal adenopathy noted.  The stomach and the small bowel loops have a normal course and caliber.  No obstruction.  There are postoperative changes from a right hemicolectomy with enterocolonic anastomoses.  There are multiple distal colonic diverticula without acute inflammation.  No free fluid or abnormal fluid collections identified within the abdomen or pelvis.  Review of the visualized osseous structures is unremarkable.  There is mild multilevel degenerative disc disease identified within the lumbar spine.  IMPRESSION:  1.  No acute findings identified within the abdomen or pelvis. 2.  Status post right hemicolectomy with enterocolonic anastomoses. 3.  Fatty infiltration of the liver. 4.  No evidence for bowel obstruction or abscess formation.   Original Report Authenticated By: Signa Kell, M.D.   [place] Art/ven Flow Abd Pelv Doppler  03/20/2013   *RADIOLOGY REPORT*  Clinical Data:  Testicular and groin pain.  SCROTAL ULTRASOUND DOPPLER ULTRASOUND OF THE TESTICLES  Technique: Complete ultrasound examination of the testicles, epididymis, and other scrotal structures was performed.  Color and spectral Doppler ultrasound were also utilized to evaluate blood flow to the testicles.  Comparison:  None  Findings:  Right testis:  4.5 x 2.7 x 2.7 cm.  No focal mass.  Normal echotexture.  Left testis:  4.4 x 2.8 x 2.8 cm.  No focal mass.  Normal echotexture.  Right epididymis:  Normal in appearance.  Left epididymis:  Normal appearance.  Hydrocele:  Absent  Varicocele:  Absent  Pulsed Doppler interrogation of both testes demonstrates low resistance flow bilaterally.  IMPRESSION: Normal testicular ultrasound. No evidence  for mass or torsion.   Original Report Authenticated By: Norva Pavlov, M.D.    Medications: I have reviewed the patient's current medications.  Assessment/Plan: 1.Abdominal Pain. A cause of this is unclear and has not been determined by labs are CT scan. Colonoscopy 5/13 showed some slight inflammation in ileocolonic anastomosis with biopsy showing hyperemic inflammation. Since he is having so much pain I think this should be repeated to look for any signs of chronic ischemia. We will plan for this tomorrow. This is been discussed with the patient and his family.   Kieran Arreguin JR,Kikuye Korenek L 03/22/2013, 12:34 PM

## 2013-03-23 ENCOUNTER — Inpatient Hospital Stay (HOSPITAL_COMMUNITY): Payer: Medicare Other

## 2013-03-23 ENCOUNTER — Encounter (HOSPITAL_COMMUNITY): Payer: Self-pay | Admitting: *Deleted

## 2013-03-23 ENCOUNTER — Encounter (HOSPITAL_COMMUNITY)
Admission: EM | Disposition: A | Discharge status: Home / Self Care | Payer: Self-pay | Source: Home / Self Care | Attending: Internal Medicine

## 2013-03-23 HISTORY — PX: COLONOSCOPY: SHX5424

## 2013-03-23 SURGERY — COLONOSCOPY
Anesthesia: Moderate Sedation

## 2013-03-23 MED ORDER — DIPHENHYDRAMINE HCL 50 MG/ML IJ SOLN
INTRAMUSCULAR | Status: DC | PRN
  Administered 2013-03-23: 25 mg via INTRAVENOUS

## 2013-03-23 MED ORDER — OXYCODONE-ACETAMINOPHEN 5-325 MG PO TABS
2.0000 | ORAL_TABLET | Freq: Four times a day (QID) | ORAL | Status: DC | PRN
  Administered 2013-03-23 – 2013-03-24 (×2): 2 via ORAL
  Filled 2013-03-23 (×2): qty 2

## 2013-03-23 MED ORDER — FENTANYL CITRATE 0.05 MG/ML IJ SOLN
INTRAMUSCULAR | Status: DC | PRN
  Administered 2013-03-23 (×5): 25 ug via INTRAVENOUS

## 2013-03-23 MED ORDER — MIDAZOLAM HCL 5 MG/5ML IJ SOLN
INTRAMUSCULAR | Status: DC | PRN
  Administered 2013-03-23 (×6): 2 mg via INTRAVENOUS

## 2013-03-23 MED ORDER — SODIUM CHLORIDE 0.9 % IV SOLN
INTRAVENOUS | Status: DC
  Administered 2013-03-23: 500 mL via INTRAVENOUS

## 2013-03-23 NOTE — Interval H&P Note (Signed)
History and Physical Interval Note:  03/23/2013 12:09 PM  Joe [name]  has presented today for surgery, with the diagnosis of abd pain  The various methods of treatment have been discussed with the patient and family. After consideration of risks, benefits and other options for treatment, the patient has consented to  Procedure(s): COLONOSCOPY (N/A) as a surgical intervention .  The patient's history has been reviewed, patient examined, no change in status, stable for surgery.  I have reviewed the patient's chart and labs.  Questions were answered to the patient's satisfaction.     Donnella Morford JR,Harlem Thresher L

## 2013-03-23 NOTE — Progress Notes (Signed)
[facility] PROGRESS NOTE Interim History: 52 yo male h/o colon cancer s/p bowel resection, htn comes in with 3 days of worsening rlq abd pain associated with n/v which is nonbloody. Denies fevers. Did have a loose nonbloody bm this am, is passing gas. He has a scar at his rlq and the pain is located under the scar tissue, ct abdomen and pelvis: No acute findings identified within the abdomen or pelvis. Status post right hemicolectomy with enterocolonic anastomoses. Fatty infiltration of the liver   Assessment/Plan:  Nausea & vomiting/  *Abdominal pain, acute, right lower quadrant: - colonoscopy 7.11.2014: no showed no source of pain. Gi rec Abdominal series. - Ct abdomen and pelvis 7.8.2014 showed no acute finding. - LFT's with in normal limits. - scrotal US and arterial flow negative. - Abdominal U/s no cholecystitis - UDS bzd'a and narcotics.  Asthma - stable.   Anxiety and depression - stable.    H/O colon cancer, stage III - oncologist as an outpatient.    Code Status: full Family Communication: wife  Disposition Plan: inpatinet   Consultants:  GI  Procedures:  Ct abdomen  U/S  Antibiotics:  None  HPI/Subjective: Pain unchanged.   Objective: Filed Vitals:   03/23/13 0811 03/23/13 1046 03/23/13 1205 03/23/13 1245  BP:  147/83 157/83 99/71  Pulse:  56 56   Temp:  98.1 F (36.7 C)  98.2 F (36.8 C)  TempSrc:  Oral  Oral  Resp:  14 27 17   Height:  5\' 7"  (1.702 m)    Weight:  94.802 kg (209 lb)    SpO2: 94% 96% 96% 95%   No intake or output data in the 24 hours ending 03/23/13 1332 Filed Weights   03/20/13 2242 03/23/13 1046  Weight: 101.152 kg (223 lb) 94.802 kg (209 lb)    Exam:  General: Alert, awake, oriented x3, in no acute distress.  HEENT: No bruits, no goiter.  Heart: Regular rate and rhythm, without murmurs, rubs, gallops.  Lungs: Good air movement, clear to auscultation. Abdomen: Soft, tender in RLQ. Neuro: Grossly intact,  nonfocal.   Data Reviewed: Basic Metabolic Panel:  Recent Labs Lab 03/20/13 1510 03/21/13 0520  NA 139 138  K 4.1 3.8  CL 103 102  CO2 26 26  GLUCOSE 114* 119*  BUN 11 11  CREATININE 1.09 0.99  CALCIUM 9.4 8.8   Liver Function Tests:  Recent Labs Lab 03/20/13 1510  AST 36  ALT 51  ALKPHOS 69  BILITOT 0.3  PROT 6.7  ALBUMIN 3.7    Recent Labs Lab 03/20/13 1510  LIPASE 25   No results found for this basename: AMMONIA,  in the last 168 hours CBC:  Recent Labs Lab 03/20/13 1510 03/21/13 0520  WBC 6.6 6.3  NEUTROABS 4.5  --   HGB 13.4 12.2*  HCT 38.8* 36.7*  MCV 90.7 92.2  PLT 160 144*   Cardiac Enzymes: No results found for this basename: CKTOTAL, CKMB, CKMBINDEX, TROPONINI,  in the last 168 hours BNP (last 3 results) No results found for this basename: PROBNP,  in the last 8760 hours CBG: No results found for this basename: GLUCAP,  in the last 168 hours  No results found for this or any previous visit (from the past 240 hour(s)).   Studies: No results found.  Scheduled Meds: . allopurinol  300 mg Oral Daily  . budesonide-formoterol  2 puff Inhalation BID  . citalopram  40 mg Oral Daily  . gabapentin  600 mg Oral  BID  . pantoprazole  20 mg Oral Daily  . sodium chloride  3 mL Intravenous Q12H   Continuous Infusions: . sodium chloride 500 mL (03/23/13 1105)     Radonna Ricker Rosine Beat  [facility] Pager [phone]. If 8PM-8AM, please contact night-coverage at [email or web address], password [email or web address] 03/23/2013, 1:32 PM  LOS: 3 days

## 2013-03-23 NOTE — Op Note (Signed)
[facility] [address] [place], [address]   COLONOSCOPY PROCEDURE REPORT  PATIENT: Joe [name], Joe [name]  MR#: [id number] BIRTHDATE: 1961/01/02 , 52  yrs. old GENDER: Male ENDOSCOPIST: Carman Ching, MD REFERRED BY:   [facility]. PCP: Dr. Turner Daniels PROCEDURE DATE:  03/23/2013 PROCEDURE:     Colonoscopy ASA CLASS:    class 2 INDICATIONS:  status post right hemicolectomy for colon cancer with persistent abdominal pain ever since. Several prior admissions were negative studies. CT negative. Colonoscopy done to evaluate for possible ischemia at anastomosis or obstruction MEDICATIONS:    fentanyl 125 mcg, versed 12 mg, Benadryl 25 mg IV  DESCRIPTION OF PROCEDURE:  The Pentax adult colonoscope was inserted following digital rectal exam and advanced. Prep excellent. Few scattered diverticula and sigmoid colon. Easily able to advance to anastomosis and passed into small bowel for about 10 cm. Small bowel was normal. No ischemic changes seen. Scope with drawn in mucosal was normal throughout. Retroflex view of rectum normal.     COMPLICATIONS: None  ENDOSCOPIC IMPRESSION: 1. Status post right hemicolectomy for colon cancer. 2. Chronic unremitting abdominal pain without cause determined on colonoscopy  RECOMMENDATIONS: we will obtain small bowel series to evaluate for possible partial obstruction, partial hernia etc. If this is negative he may well need referral to a pain clinic.    _______________________________ Rosalie DoctorCarman Ching, MD 03/23/2013 12:40 PM  CC: Dr Turner Daniels

## 2013-03-23 NOTE — H&P (View-Only) (Signed)
[facility] PROGRESS NOTE Subjective Patient still having some pain and bloating it better.  Objective: Vital signs in last 24 hours: Temp:  [98 F (36.7 C)-98.2 F (36.8 C)] 98 F (36.7 C) (07/10 0600) Pulse Rate:  [68-84] 68 (07/10 0600) Resp:  [18] 18 (07/10 0600) BP: (132-143)/(70-84) 132/70 mmHg (07/10 0600) SpO2:  [94 %-96 %] 96 % (07/10 0600) Last BM Date: 03/20/13  Intake/Output from previous day: 07/09 0701 - 07/10 0700 In: 720 [P.O.:720] Out: -  Intake/Output this shift: Total I/O In: 120 [P.O.:120] Out: -   PE: General--no acute distress Heart-- Lungs--clear Abdomen--slightly distended nontender with good bowel sounds  Lab Results:  Recent Labs  03/20/13 1510 03/21/13 0520  WBC 6.6 6.3  HGB 13.4 12.2*  HCT 38.8* 36.7*  PLT 160 144*   BMET  Recent Labs  03/20/13 1510 03/21/13 0520  NA 139 138  K 4.1 3.8  CL 103 102  CO2 26 26  CREATININE 1.09 0.99   LFT  Recent Labs  03/20/13 1510  PROT 6.7  AST 36  ALT 51  ALKPHOS 69  BILITOT 0.3  BILIDIR <0.1  IBILI NOT CALCULATED   PT/INR  Recent Labs  03/21/13 0520  LABPROT 13.4  INR 1.04   PANCREAS  Recent Labs  03/20/13 1510  LIPASE 25         Studies/Results: Us Abdomen Complete  03/21/2013   *RADIOLOGY REPORT*  Clinical Data:  The right lower quadrant abdominal pain, history of carcinoma of the colon  COMPLETE ABDOMINAL ULTRASOUND  Comparison:  CT abdomen pelvis of 03/20/2013  Findings:  Gallbladder:  The gallbladder is visualized and no gallstones are noted.  There is no pain over the gallbladder with compression.  Common bile duct:  The common bile duct is normal measuring 3.3 mm in diameter.  Liver:  The liver is diffusely echogenic consistent with fatty infiltration.  No focal abnormality is seen.  IVC:  Appears normal.  Pancreas:  No focal abnormality seen.  Spleen:  The spleen is normal in size measuring 11.1 cm sagittally.  Right Kidney:  No hydronephrosis is  seen.  The right kidney measures 11.4 cm sagittally.  Left Kidney:  No hydronephrosis is noted.  The left kidney measures 12.1 cm.  Abdominal aorta:  The abdominal aorta is normal in caliber.  IMPRESSION:  1.  Echogenic liver consistent with diffuse fatty infiltration.  No focal abnormality. 2.  No gallstones.   Original Report Authenticated By: Paul Barry, M.D.   Us Scrotum  03/20/2013   *RADIOLOGY REPORT*  Clinical Data:  Testicular and groin pain.  SCROTAL ULTRASOUND DOPPLER ULTRASOUND OF THE TESTICLES  Technique: Complete ultrasound examination of the testicles, epididymis, and other scrotal structures was performed.  Color and spectral Doppler ultrasound were also utilized to evaluate blood flow to the testicles.  Comparison:  None  Findings:  Right testis:  4.5 x 2.7 x 2.7 cm.  No focal mass.  Normal echotexture.  Left testis:  4.4 x 2.8 x 2.8 cm.  No focal mass.  Normal echotexture.  Right epididymis:  Normal in appearance.  Left epididymis:  Normal appearance.  Hydrocele:  Absent  Varicocele:  Absent  Pulsed Doppler interrogation of both testes demonstrates low resistance flow bilaterally.  IMPRESSION: Normal testicular ultrasound. No evidence for mass or torsion.   Original Report Authenticated By: Elizabeth Brown, M.D.   Ct Abdomen Pelvis W Contrast  03/20/2013   *RADIOLOGY REPORT*  Clinical Data: Rule out appendicitis  CT ABDOMEN AND   PELVIS WITH CONTRAST  Technique:  Multidetector CT imaging of the abdomen and pelvis was performed following the standard protocol during bolus administration of intravenous contrast.  Contrast: 100mL OMNIPAQUE IOHEXOL 300 MG/ML  SOLN, 50mL OMNIPAQUE IOHEXOL 300 MG/ML  SOLN  Comparison: None.  Findings: There is no pleural or pericardial effusion.  There is mild diffuse low attenuation throughout the liver parenchyma.  No focal liver abnormalities noted.  The gallbladder appears within normal limits.  No biliary dilatation.  Normal appearance of the pancreas.  The spleen  is normal.  The adrenal glands are both normal.  Normal appearance of the right kidney.  The left kidney is also normal.  Urinary bladder is unremarkable.  Prostate gland and seminal vesicles are unremarkable.  Calcified atherosclerotic change involves the abdominal aorta.  No aneurysm.  No pelvic or inguinal adenopathy noted.  The stomach and the small bowel loops have a normal course and caliber.  No obstruction.  There are postoperative changes from a right hemicolectomy with enterocolonic anastomoses.  There are multiple distal colonic diverticula without acute inflammation.  No free fluid or abnormal fluid collections identified within the abdomen or pelvis.  Review of the visualized osseous structures is unremarkable.  There is mild multilevel degenerative disc disease identified within the lumbar spine.  IMPRESSION:  1.  No acute findings identified within the abdomen or pelvis. 2.  Status post right hemicolectomy with enterocolonic anastomoses. 3.  Fatty infiltration of the liver. 4.  No evidence for bowel obstruction or abscess formation.   Original Report Authenticated By: Taylor Stroud, M.D.   Us Art/ven Flow Abd Pelv Doppler  03/20/2013   *RADIOLOGY REPORT*  Clinical Data:  Testicular and groin pain.  SCROTAL ULTRASOUND DOPPLER ULTRASOUND OF THE TESTICLES  Technique: Complete ultrasound examination of the testicles, epididymis, and other scrotal structures was performed.  Color and spectral Doppler ultrasound were also utilized to evaluate blood flow to the testicles.  Comparison:  None  Findings:  Right testis:  4.5 x 2.7 x 2.7 cm.  No focal mass.  Normal echotexture.  Left testis:  4.4 x 2.8 x 2.8 cm.  No focal mass.  Normal echotexture.  Right epididymis:  Normal in appearance.  Left epididymis:  Normal appearance.  Hydrocele:  Absent  Varicocele:  Absent  Pulsed Doppler interrogation of both testes demonstrates low resistance flow bilaterally.  IMPRESSION: Normal testicular ultrasound. No evidence  for mass or torsion.   Original Report Authenticated By: Elizabeth Brown, M.D.    Medications: I have reviewed the patient's current medications.  Assessment/Plan: 1.Abdominal Pain. A cause of this is unclear and has not been determined by labs are CT scan. Colonoscopy 5/13 showed some slight inflammation in ileocolonic anastomosis with biopsy showing hyperemic inflammation. Since he is having so much pain I think this should be repeated to look for any signs of chronic ischemia. We will plan for this tomorrow. This is been discussed with the patient and his family.   Eleen Litz JR,Cayne Yom L 03/22/2013, 12:34 PM   

## 2013-03-24 MED ORDER — OXYCODONE-ACETAMINOPHEN 5-325 MG PO TABS: 2.0000 | ORAL_TABLET | ORAL | Status: AC | PRN

## 2013-03-24 NOTE — Progress Notes (Signed)
Discharge instruction given understanding verbalized, patient discharge home with wife and  Granddaughter. Will continue to monitor.

## 2013-03-24 NOTE — Progress Notes (Signed)
[facility] Progress Note  Subjective: Still c/o RLQ abdominal pain and tenderness as well as early satietyObjective: Vital signs in last 24 hours: Temp:  [97.4 F (36.3 C)-98.2 F (36.8 C)] 98.2 F (36.8 C) (07/12 0600) Pulse Rate:  [48-65] 60 (07/12 0600) Resp:  [14-27] 18 (07/12 0600) BP: (99-165)/(71-97) 152/81 mmHg (07/12 0600) SpO2:  [95 %-98 %] 95 % (07/12 0600) Weight:  [94.802 kg (209 lb)] 94.802 kg (209 lb) 04/09/23 1046) Weight change:    UV:OZDGU tenderness just inferior to RLQ surgical scar  Lab Results: No results found for this or any previous visit (from the past 24 hour(s)).  Studies/Results: Dg Small Bowel  2013-04-08   *RADIOLOGY REPORT*  Clinical Data:Right side abdominal pain. Nausea.  SMALL BOWEL SERIES  Technique:  Following ingestion of a mixture of thin barium and EnteroVu, serial small bowel images were obtained including spot views of the terminal ileum.  Fluoroscopy Time: Seconds.  Comparison: Plain films of the abdomen this same day and CT abdomen and pelvis 03/20/2013.  Findings: No evidence of bowel obstruction is identified.  No stricture or inflammatory change is identified.  Contrast flows into the colon.  The terminal ileum appears normal.  IMPRESSION: Negative for bowel obstruction.  No acute finding.   Original Report Authenticated By: Holley Dexter, M.D.   Dg Abd 2 Views  04-08-13   *RADIOLOGY REPORT*  Clinical Data: Right lower quadrant pain.  Nausea.  ABDOMEN - 2 VIEW  Comparison: CT abdomen and pelvis 03/20/2013.  Findings: There is no free intraperitoneal air.  The bowel gas pattern is normal.  No bony abnormality or abnormal abdominal calcification is identified.  IMPRESSION: Negative exam.   Original Report Authenticated By: Holley Dexter, M.D.      Assessment: 1. 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 Consider pain clinic for consideration of injection therapy for anterior cutaneous nerve entrapment syndrome  Based on early satiety and food in stomach on previous EGD, consider gastric emptying scan (was ordered in 2013 but do not see a result). Would not explain RLQ pain though and may be influenced by narcotics    Preet Mangano C 03/24/2013, 8:55 AM

## 2013-03-24 NOTE — Discharge Summary (Signed)
Physician Discharge Summary  Joe [name] ZOX:[id number] DOB: September 23, 1960 DOA: 03/20/2013  PCP: Deloris Ping, MD  Admit date: 03/20/2013 Discharge date: 03/24/2013  Time spent: 45 minutes  Recommendations for Outpatient Follow-up:  1. Follow up with GI as an outpatinet in 4 weeks. 2. Pain clinic for management  Discharge Diagnoses:  Principal Problem:   Abdominal pain, acute, right lower quadrant Active Problems:   Asthma   Anxiety and depression   H/O colon cancer, stage III   Nausea & vomiting   Discharge Condition: stable  Diet recommendation: regular  Filed Weights   03/20/13 2242 03/23/13 1046  Weight: 101.152 kg (223 lb) 94.802 kg (209 lb)    History of present illness:  52 yo male h/o colon cancer s/p bowel resection, htn comes in with 3 days of worsening rlq abd pain associated with n/v which is nonbloody. Denies fevers. Did have a loose nonbloody bm this am, is passing gas. He has a scar at his rlq and the pain is located under the scar tissue. Was seen today by dr outlaw as outpatient and sent to ED for severity of his pain. Pt states that he has actually had pain here for over 2 years ever since his colon surgery, however since Sunday its markedly worse. Was seen in [facility] ED on Sunday and did have ct scan that did not show any acute findings per report. Pt still has his appendix.   Hospital Course:  Nausea & vomiting/ *Abdominal pain, acute, right lower quadrant:  - GI consulted rec: - colonoscopy 7.11.2014: no showed no source of pain. Gi rec Abdominal series.  - Ct abdomen and pelvis 7.8.2014 showed no acute finding.  - scrotal US and arterial flow negative.  - Abdominal U/s no cholecystitis  - LFT's with in normal limits.  - Abd x-ray showed no ileus. - UDS bzd'a and narcotics.  - at this point the etiology of his abdominal pain remains unclear, hecont to have pain and required narcotics. He is tolerating all of his diet and ambulating the halls. 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  Asthma  - stable.   Anxiety and depression  - stable.   H/O colon cancer, stage III  - oncologist as an outpatient.    Procedures:  Colonoscopy 7.11.2014: as above  Ct abd and pelvis: as below  abd X-ray below  Consultations:  GI  Discharge Exam: Filed Vitals:   03/23/13 1643 03/23/13 1959 03/23/13 2200 03/24/13 0600  BP: 156/76  144/82 152/81  Pulse: 48  65 60  Temp: 97.4 F (36.3 C)  98.1 F (36.7 C) 98.2 F (36.8 C)  TempSrc: Oral  Oral Oral  Resp: 16  18 18   Height:      Weight:      SpO2: 98% 97% 98% 95%    General: A&O x3 Cardiovascular: RRR Respiratory: good air movement CTA B/L  Discharge Instructions  Discharge Orders   Future Appointments Provider Department Dept Phone   08/24/2013 8:00 AM Delcie Roch [facility] [phone]   08/24/2013 9:00 AM [facility] [phone]   Patient to arrive 15 minutes prior to appointment time. Patient to pick up oral contrast at least 1 day prior to exam, unless otherwise instructed by your physician. No solid food 4 hours prior to exam. Liquids and Medicines are okay.   08/27/2013 9:30 AM Ladene Artist, MD [facility] [phone]   Future Orders Complete By Expires     Diet - low sodium heart healthy  As directed     Increase activity slowly  As directed         Medication List         albuterol 108 (90 BASE) MCG/ACT inhaler  Commonly known as:  PROVENTIL HFA;VENTOLIN HFA  Inhale 2 puffs into the lungs every 6 (six) hours as needed. Wheezing or shortness of breath.     allopurinol 300 MG tablet  Commonly known as:  ZYLOPRIM  Take 300 mg by mouth daily.     amitriptyline 25 MG tablet  Commonly known as:  ELAVIL  Take 25 mg by mouth at bedtime.     aspirin 81 MG tablet  Take 81 mg by mouth daily.      citalopram 40 MG tablet  Commonly known as:  CELEXA  Take 40 mg by mouth daily.     diazepam 10 MG tablet  Commonly known as:  VALIUM  Take 5 mg by mouth every 8 (eight) hours as needed.     docusate sodium 100 MG capsule  Commonly known as:  COLACE  Take 100 mg by mouth 2 (two) times daily.     gabapentin 300 MG capsule  Commonly known as:  NEURONTIN  Take 600 mg by mouth 2 (two) times daily.     lisinopril-hydrochlorothiazide 20-25 MG per tablet  Commonly known as:  PRINZIDE,ZESTORETIC  Take 1 tablet by mouth daily.     oxyCODONE-acetaminophen 5-325 MG per tablet  Commonly known as:  PERCOCET/ROXICET  Take 2 tablets by mouth every 4 (four) hours as needed for pain.     pantoprazole 20 MG tablet  Commonly known as:  PROTONIX  Take 20 mg by mouth daily.     SYMBICORT 80-4.5 MCG/ACT inhaler  Generic drug:  budesonide-formoterol  Inhale 2 puffs into the lungs 2 (two) times daily.       No Known Allergies     Follow-up Information   Follow up with Deloris Ping, MD In 2 weeks. (hospital follow up)    Contact information:   [address] [facility] [address] [phone]        The results of significant diagnostics from this hospitalization (including imaging, microbiology, ancillary and laboratory) are listed below for reference.    Significant Diagnostic Studies: US Abdomen Complete  03/21/2013   *RADIOLOGY REPORT*  Clinical Data:  The right lower quadrant abdominal pain, history of carcinoma of the colon  COMPLETE ABDOMINAL ULTRASOUND  Comparison:  CT abdomen pelvis of 03/20/2013  Findings:  Gallbladder:  The gallbladder is visualized and no gallstones are noted.  There is no pain over the gallbladder with compression.  Common bile duct:  The common bile duct is normal measuring 3.3 mm in diameter.  Liver:  The liver is diffusely echogenic consistent with fatty infiltration.  No focal abnormality is seen.  IVC:  Appears normal.  Pancreas:  No  focal abnormality seen.  Spleen:  The spleen is normal in size measuring 11.1 cm sagittally.  Right Kidney:  No hydronephrosis is seen.  The right kidney measures 11.4 cm sagittally.  Left Kidney:  No hydronephrosis is noted.  The left kidney measures 12.1 cm.  Abdominal aorta:  The abdominal aorta is normal in caliber.  IMPRESSION:  1.  Echogenic liver consistent with diffuse fatty infiltration.  No focal abnormality. 2.  No gallstones.   Original  Report Authenticated By: Dwyane Dee, M.D.   US Scrotum  03/20/2013   *RADIOLOGY REPORT*  Clinical Data:  Testicular and groin pain.  SCROTAL ULTRASOUND DOPPLER ULTRASOUND OF THE TESTICLES  Technique: Complete ultrasound examination of the testicles, epididymis, and other scrotal structures was performed.  Color and spectral Doppler ultrasound were also utilized to evaluate blood flow to the testicles.  Comparison:  None  Findings:  Right testis:  4.5 x 2.7 x 2.7 cm.  No focal mass.  Normal echotexture.  Left testis:  4.4 x 2.8 x 2.8 cm.  No focal mass.  Normal echotexture.  Right epididymis:  Normal in appearance.  Left epididymis:  Normal appearance.  Hydrocele:  Absent  Varicocele:  Absent  Pulsed Doppler interrogation of both testes demonstrates low resistance flow bilaterally.  IMPRESSION: Normal testicular ultrasound. No evidence for mass or torsion.   Original Report Authenticated By: Norva Pavlov, M.D.   Ct Abdomen Pelvis W Contrast  03/20/2013   *RADIOLOGY REPORT*  Clinical Data: Rule out appendicitis  CT ABDOMEN AND PELVIS WITH CONTRAST  Technique:  Multidetector CT imaging of the abdomen and pelvis was performed following the standard protocol during bolus administration of intravenous contrast.  Contrast: OMNIPAQUE IOHEXOL 300 MG/ML  SOLN, 50mL OMNIPAQUE IOHEXOL 300 MG/ML  SOLN  Comparison: None.  Findings: There is no pleural or pericardial effusion.  There is mild diffuse low attenuation throughout the liver parenchyma.  No focal liver  abnormalities noted.  The gallbladder appears within normal limits.  No biliary dilatation.  Normal appearance of the pancreas.  The spleen is normal.  The adrenal glands are both normal.  Normal appearance of the right kidney.  The left kidney is also normal.  Urinary bladder is unremarkable.  Prostate gland and seminal vesicles are unremarkable.  Calcified atherosclerotic change involves the abdominal aorta.  No aneurysm.  No pelvic or inguinal adenopathy noted.  The stomach and the small bowel loops have a normal course and caliber.  No obstruction.  There are postoperative changes from a right hemicolectomy with enterocolonic anastomoses.  There are multiple distal colonic diverticula without acute inflammation.  No free fluid or abnormal fluid collections identified within the abdomen or pelvis.  Review of the visualized osseous structures is unremarkable.  There is mild multilevel degenerative disc disease identified within the lumbar spine.  IMPRESSION:  1.  No acute findings identified within the abdomen or pelvis. 2.  Status post right hemicolectomy with enterocolonic anastomoses. 3.  Fatty infiltration of the liver. 4.  No evidence for bowel obstruction or abscess formation.   Original Report Authenticated By: Signa Kell, M.D.   Dg Small Bowel  03/23/2013   *RADIOLOGY REPORT*  Clinical Data:Right side abdominal pain. Nausea.  SMALL BOWEL SERIES  Technique:  Following ingestion of a mixture of thin barium and EnteroVu, serial small bowel images were obtained including spot views of the terminal ileum.  Fluoroscopy Time: Seconds.  Comparison: Plain films of the abdomen this same day and CT abdomen and pelvis 03/20/2013.  Findings: No evidence of bowel obstruction is identified.  No stricture or inflammatory change is identified.  Contrast flows into the colon.  The terminal ileum appears normal.  IMPRESSION: Negative for bowel obstruction.  No acute finding.   Original Report Authenticated By: Holley Dexter, M.D.   [place] Art/ven Flow Abd Pelv Doppler  03/20/2013   *RADIOLOGY REPORT*  Clinical Data:  Testicular and groin pain.  SCROTAL ULTRASOUND DOPPLER ULTRASOUND OF THE TESTICLES  Technique: Complete ultrasound  examination of the testicles, epididymis, and other scrotal structures was performed.  Color and spectral Doppler ultrasound were also utilized to evaluate blood flow to the testicles.  Comparison:  None  Findings:  Right testis:  4.5 x 2.7 x 2.7 cm.  No focal mass.  Normal echotexture.  Left testis:  4.4 x 2.8 x 2.8 cm.  No focal mass.  Normal echotexture.  Right epididymis:  Normal in appearance.  Left epididymis:  Normal appearance.  Hydrocele:  Absent  Varicocele:  Absent  Pulsed Doppler interrogation of both testes demonstrates low resistance flow bilaterally.  IMPRESSION: Normal testicular ultrasound. No evidence for mass or torsion.   Original Report Authenticated By: Norva Pavlov, M.D.   Dg Abd 2 Views  03/23/2013   *RADIOLOGY REPORT*  Clinical Data: Right lower quadrant pain.  Nausea.  ABDOMEN - 2 VIEW  Comparison: CT abdomen and pelvis 03/20/2013.  Findings: There is no free intraperitoneal air.  The bowel gas pattern is normal.  No bony abnormality or abnormal abdominal calcification is identified.  IMPRESSION: Negative exam.   Original Report Authenticated By: Holley Dexter, M.D.    Microbiology: No results found for this or any previous visit (from the past 240 hour(s)).   Labs: Basic Metabolic Panel:  Recent Labs Lab 03/20/13 1510 03/21/13 0520  NA 139 138  K 4.1 3.8  CL 103 102  CO2 26 26  GLUCOSE 114* 119*  BUN 11 11  CREATININE 1.09 0.99  CALCIUM 9.4 8.8   Liver Function Tests:  Recent Labs Lab 03/20/13 1510  AST 36  ALT 51  ALKPHOS 69  BILITOT 0.3  PROT 6.7  ALBUMIN 3.7    Recent Labs Lab 03/20/13 1510  LIPASE 25   No results found for this basename: AMMONIA,  in the last 168 hours CBC:  Recent Labs Lab 03/20/13 1510 03/21/13 0520   WBC 6.6 6.3  NEUTROABS 4.5  --   HGB 13.4 12.2*  HCT 38.8* 36.7*  MCV 90.7 92.2  PLT 160 144*   Cardiac Enzymes: No results found for this basename: CKTOTAL, CKMB, CKMBINDEX, TROPONINI,  in the last 168 hours BNP: BNP (last 3 results) No results found for this basename: PROBNP,  in the last 8760 hours CBG: No results found for this basename: GLUCAP,  in the last 168 hours   Signed:  Marinda Elk  [facility] 03/24/2013, 9:42 AM

## 2013-03-26 ENCOUNTER — Encounter (HOSPITAL_COMMUNITY): Payer: Self-pay | Admitting: Gastroenterology

## 2013-04-01 ENCOUNTER — Encounter: Payer: Self-pay | Admitting: Oncology

## 2013-04-04 ENCOUNTER — Telehealth: Payer: Self-pay | Admitting: *Deleted

## 2013-04-04 NOTE — Telephone Encounter (Signed)
Mailed letter patient requested to home. Pt notified

## 2013-07-19 ENCOUNTER — Other Ambulatory Visit: Payer: Self-pay

## 2013-08-13 ENCOUNTER — Telehealth: Payer: Self-pay | Admitting: Oncology

## 2013-08-13 NOTE — Telephone Encounter (Signed)
Called pt,left message regarding MD visit, moved from 12/15 to 12/22 per MD

## 2013-08-24 ENCOUNTER — Ambulatory Visit (HOSPITAL_COMMUNITY)
Admission: RE | Admit: 2013-08-24 | Discharge: 2013-08-24 | Disposition: A | Discharge status: Home / Self Care | Payer: Medicare Other | Source: Ambulatory Visit | Attending: Nurse Practitioner | Admitting: Nurse Practitioner

## 2013-08-24 ENCOUNTER — Other Ambulatory Visit (HOSPITAL_BASED_OUTPATIENT_CLINIC_OR_DEPARTMENT_OTHER): Payer: Medicare Other

## 2013-08-24 ENCOUNTER — Encounter (HOSPITAL_COMMUNITY): Payer: Self-pay

## 2013-08-24 DIAGNOSIS — Z9221 Personal history of antineoplastic chemotherapy: Secondary | ICD-10-CM | POA: Insufficient documentation

## 2013-08-24 DIAGNOSIS — K4090 Unilateral inguinal hernia, without obstruction or gangrene, not specified as recurrent: Secondary | ICD-10-CM | POA: Insufficient documentation

## 2013-08-24 DIAGNOSIS — I7090 Unspecified atherosclerosis: Secondary | ICD-10-CM | POA: Insufficient documentation

## 2013-08-24 DIAGNOSIS — C189 Malignant neoplasm of colon, unspecified: Secondary | ICD-10-CM | POA: Insufficient documentation

## 2013-08-24 DIAGNOSIS — Z9049 Acquired absence of other specified parts of digestive tract: Secondary | ICD-10-CM | POA: Insufficient documentation

## 2013-08-24 DIAGNOSIS — K7689 Other specified diseases of liver: Secondary | ICD-10-CM | POA: Insufficient documentation

## 2013-08-24 LAB — COMPREHENSIVE METABOLIC PANEL (CC13)
ALT: 97 U/L — ABNORMAL HIGH (ref 0–55)
AST: 58 U/L — ABNORMAL HIGH (ref 5–34)
Albumin: 4.1 g/dL (ref 3.5–5.0)
Anion Gap: 13 mEq/L — ABNORMAL HIGH (ref 3–11)
CO2: 22 mEq/L (ref 22–29)
Calcium: 9.8 mg/dL (ref 8.4–10.4)
Chloride: 101 mEq/L (ref 98–109)
Creatinine: 1.3 mg/dL (ref 0.7–1.3)
Potassium: 5.1 mEq/L (ref 3.5–5.1)

## 2013-08-24 LAB — CEA: CEA: 2.8 ng/mL (ref 0.0–5.0)

## 2013-08-24 MED ORDER — IOHEXOL 300 MG/ML  SOLN
100.0000 mL | Freq: Once | INTRAMUSCULAR | Status: AC | PRN
  Administered 2013-08-24: 100 mL via INTRAVENOUS

## 2013-08-27 ENCOUNTER — Ambulatory Visit: Payer: Medicare Other | Admitting: Oncology

## 2013-09-03 ENCOUNTER — Ambulatory Visit (HOSPITAL_BASED_OUTPATIENT_CLINIC_OR_DEPARTMENT_OTHER): Payer: Medicare Other

## 2013-09-03 ENCOUNTER — Telehealth: Payer: Self-pay | Admitting: Oncology

## 2013-09-03 ENCOUNTER — Ambulatory Visit (HOSPITAL_BASED_OUTPATIENT_CLINIC_OR_DEPARTMENT_OTHER): Payer: Medicare Other | Admitting: Oncology

## 2013-09-03 VITALS — BP 124/75 | HR 103 | Temp 97.8°F | Resp 20 | Ht 67.0 in | Wt 227.2 lb

## 2013-09-03 DIAGNOSIS — C183 Malignant neoplasm of hepatic flexure: Secondary | ICD-10-CM

## 2013-09-03 DIAGNOSIS — I10 Essential (primary) hypertension: Secondary | ICD-10-CM

## 2013-09-03 DIAGNOSIS — G609 Hereditary and idiopathic neuropathy, unspecified: Secondary | ICD-10-CM

## 2013-09-03 DIAGNOSIS — Z85038 Personal history of other malignant neoplasm of large intestine: Secondary | ICD-10-CM

## 2013-09-03 DIAGNOSIS — T451X5A Adverse effect of antineoplastic and immunosuppressive drugs, initial encounter: Secondary | ICD-10-CM

## 2013-09-03 NOTE — Telephone Encounter (Signed)
gv pt appt schedule for june.  °

## 2013-09-03 NOTE — Progress Notes (Signed)
[facility]    OFFICE PROGRESS NOTE   INTERVAL HISTORY:   Joe [name] returns for scheduled followup of colon cancer. He continues to have numbness in the hands and feet. The feet are painful. The pain is relieved with gabapentin. He continues to have right abdominal pain. He takes Percocet for relief of pain. He was admitted in July with right abdominal pain. A colonoscopy on 03/23/2013 was negative. A small bowel series was negative on 03/23/2013.  He continues to take Percocet twice daily for the abdominal pain.  Objective:  Vital signs in last 24 hours:  Blood pressure 124/75, pulse 103, temperature 97.8 F (36.6 C), temperature source Oral, resp. rate 20, height 5\' 7"  (1.702 m), weight 227 lb 3.2 oz (103.057 kg).    HEENT: Neck without mass Lymphatics: No cervical, supra-clavicular, axillary, or inguinal nodes Resp: Lungs clear bilaterally Cardio: Regular rate and rhythm GI: No hepatosplenomegaly, no mass, tender in the right mid abdomen surrounding the surgical incision Vascular: No leg edema   Lab Results:  CEA on 08/24/2013-2.8  X-rays: CTs of the chest, abdomen, and pelvis on 08/24/2013, compared to 03/20/2013: No evidence of metastatic disease, no focal hepatic lesions in the chest. Small scattered stable mesenteric and retroperitoneal lymph nodes   Medications: I have reviewed the patient's current medications.  Assessment/Plan: 1. Stage III (T3 N1) colon cancer status post right hemicolectomy 08/20/2010. 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 He began treatment with CAPOX on 09/24/2010. He completed cycle 8 beginning 02/18/2011. Restaging CTs of the chest, abdomen, and pelvis on 08/24/2013 showed no evidence of metastatic disease. 2. Staging CT of the chest 09/17/2010 negative for evidence of metastatic disease. 3. Anemia likely related to preoperative gastrointestinal bleeding: Resolved  4. History of alcohol use. 5. History of  gout with an acute flare at the foot 11/26/2010. 6. Chronic obstructive pulmonary disease. 7. Hypertension. 8. Remote history of a bleeding gastric ulcer. 9. Family history of cancer. Microsatellite instability testing returned with a high microsatellite genotype. 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  Negative colonoscopy 03/23/2013 10. Right-sided abdominal pain surrounding the transverse incision, persistent. Question neuropathic pain related to surgery and oxaliplatin. 11. Acute neurotoxicity related to oxaliplatin manifested with leg weakness, ataxia, blurred vision following cycle 2 on 10/25/2010. The oxaliplatin was dose reduced by 50% with cycle 3 and diluted in a larger volume with administration over 4 hours. The acute neurotoxicity did not recur. 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 He has pain associated with the neuropathy. The neuropathy pain has improved with Neurontin. 13. Surveillance colonoscopy 08/17/2011 by Dr. Ewing Schlein with 2 small benign polyps at the left colon. He underwent a colonoscopy in May 2013 with inflammation at the surgical anastomosis with an appearance of "ischemia ". There was diverticulosis and a colonoscopy was otherwise normal. Negative colonoscopy 03/24/1999 14. Right flank pain and dysuria 11/29/2011. Urinalysis was remarkable for too numerous to count red cells. Abdominal x-ray showed no renal calculi. He did not complain of flank pain or dysuria at today's visit. 15. Right breast mass and palpable right axillary lymph node. He is status post a an ultrasound-guided biopsy of the right breast on 01/03/2012 with the pathology confirming a fibroadenoma. Followup mammogram negative on 06/26/2012. 16. Right abdominal pain May 2013. Question colitis. He continues to have intermittent right abdominal pain 17. Mesenteric lymph nodes noted on a CT 01/23/12-question reactive. No progressive lymphadenopathy on  the CT 08/24/2013 18. Urine cytology 08/24/2012 with reactive urothelial cells present; acute inflammation.  Disposition:  Joe [name] remains in clinical remission from colon cancer. He has hereditary non-polyposis colon cancer syndrome. We checked a urine cytology today. He will return for an office visit and CEA in 6 months.  He continues to have oxaliplatin neuropathy. The peripheral neuropathy symptoms are controlled with Elavil and gabapentin. He continues to have right abdominal pain of unclear etiology.   Joe Papas, MD  09/03/2013  11:40 AM

## 2013-09-20 IMAGING — CT CT ABD-PELV W/ CM
1 of 3 series · 13 of 32 positions shown, 18 images · IV contrast (omnipaque)
Comparison: 08/25/2011

CLINICAL DATA: Abdominal pain, nausea, history of small bowel
obstruction

CT ABDOMEN AND PELVIS WITH CONTRAST
TECHNIQUE: Multidetector CT imaging of the abdomen and pelvis was
performed following the standard protocol during bolus
administration of intravenous contrast.
Contrast: 100mL OMNIPAQUE IOHEXOL 300 MG/ML  SOLN

[Series 2: abd/pel with · axial · 0.80mm/px · z∈[+867,+1267]mm · 13 of 90 slices shown, 18 images]
[im 5/90  soft-tissue]
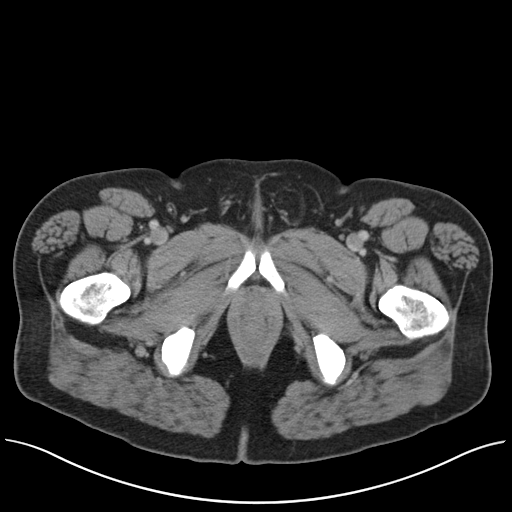
[im 5/90  bone]
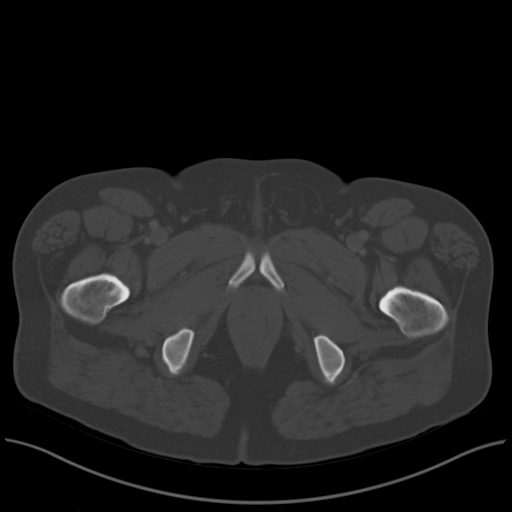
[im 15/90  soft-tissue]
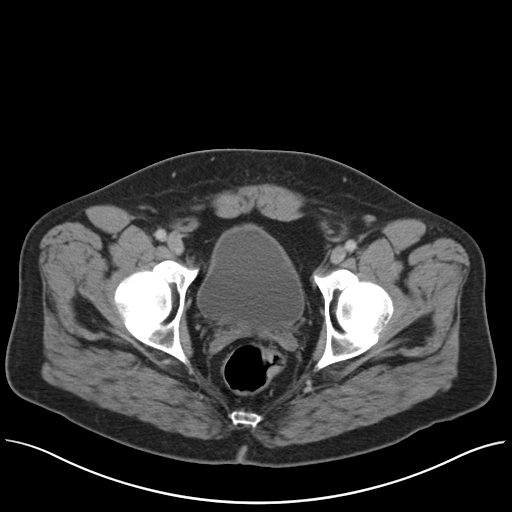
[im 20/90  soft-tissue]
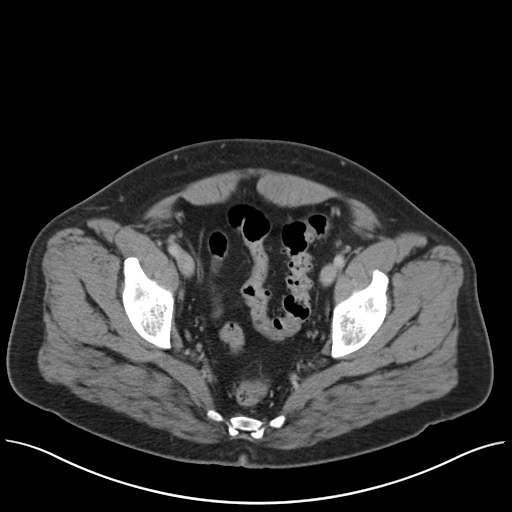
[im 25/90  soft-tissue]
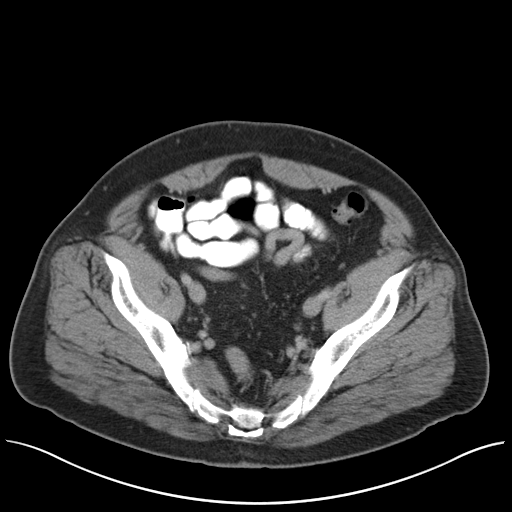
[im 35/90  soft-tissue]
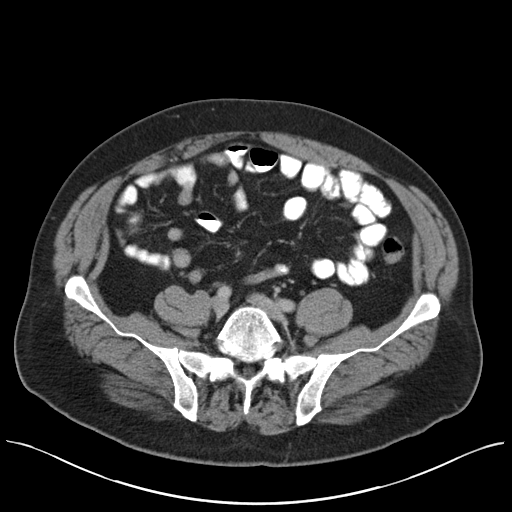
[im 40/90  soft-tissue]
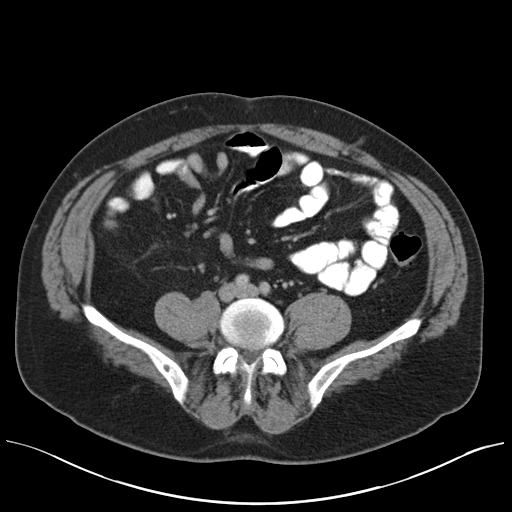
[im 50/90  soft-tissue]
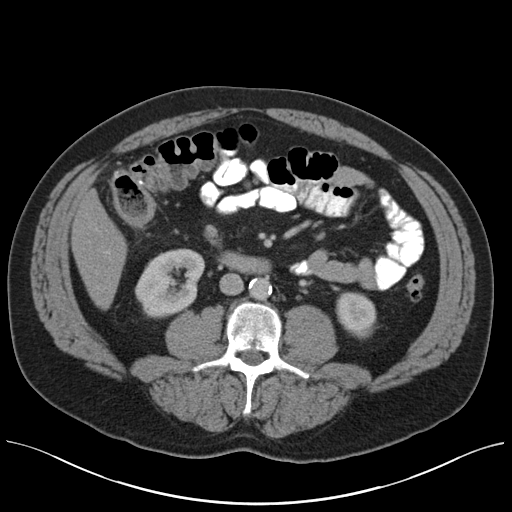
[im 55/90  soft-tissue]
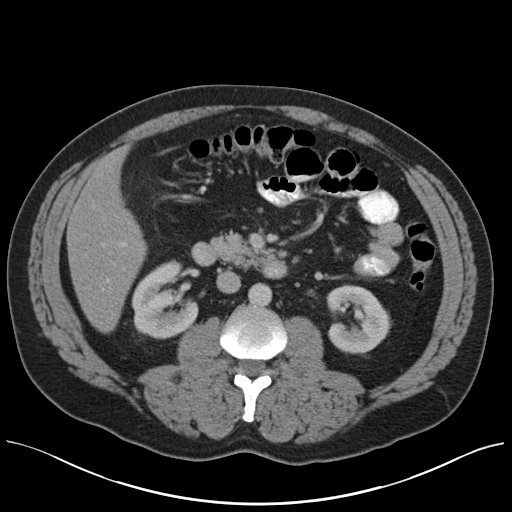
[im 65/90  soft-tissue]
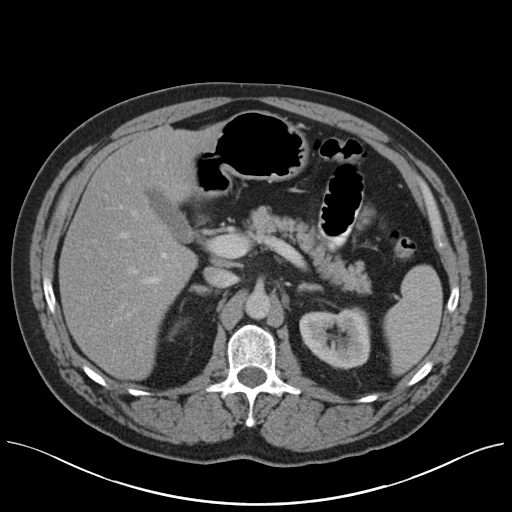
[im 65/90  bone]
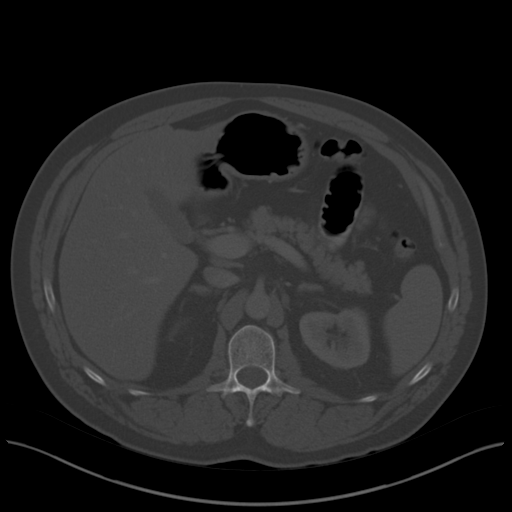
[im 70/90  soft-tissue]
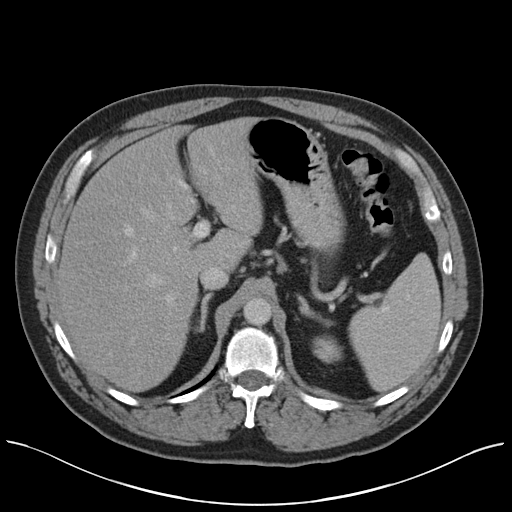
[im 70/90  lung]
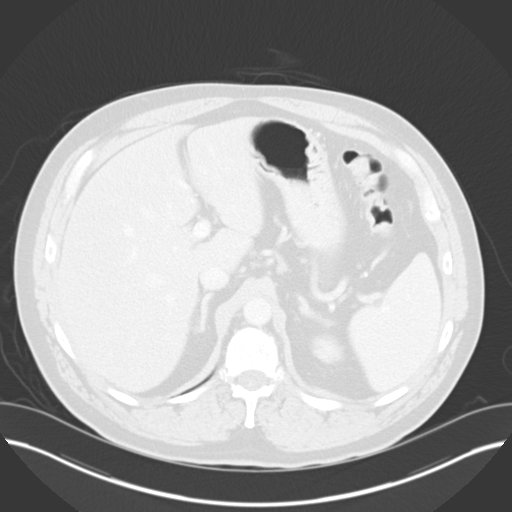
[im 75/90  soft-tissue]
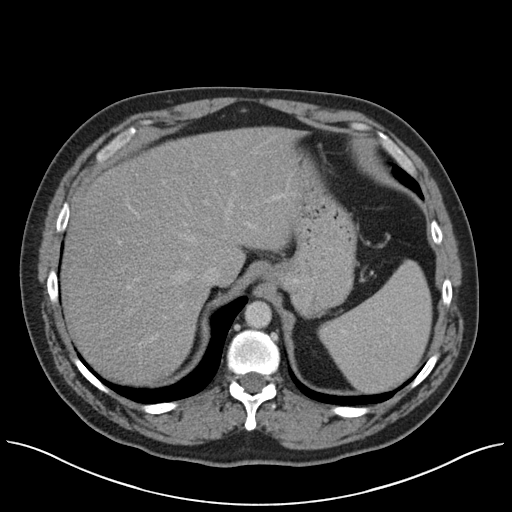
[im 75/90  lung]
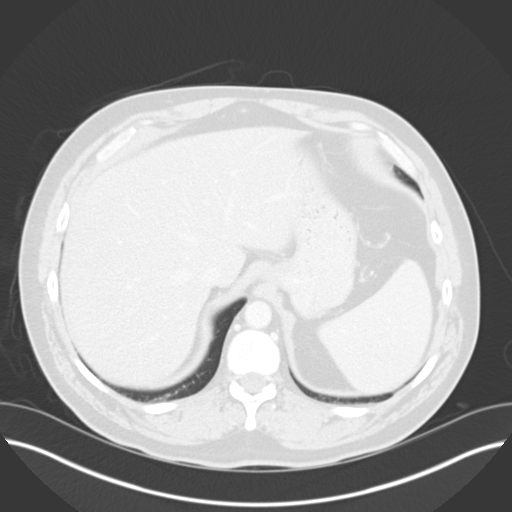
[im 80/90  lung]
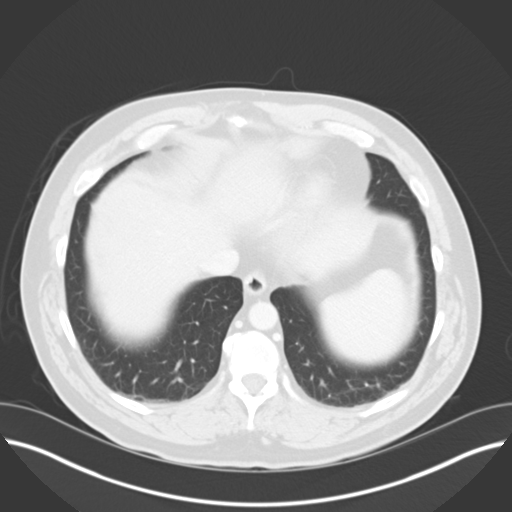
[im 85/90  soft-tissue]
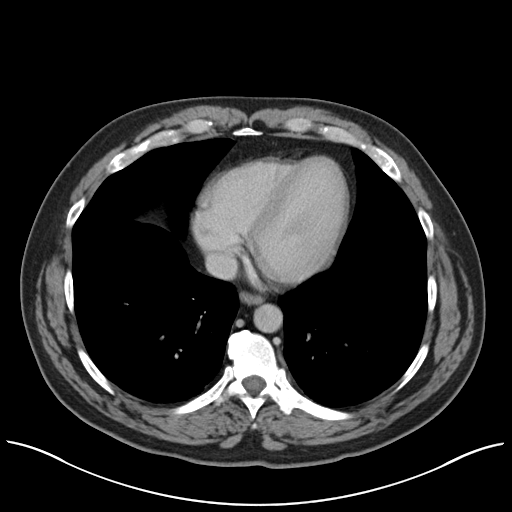
[im 85/90  lung]
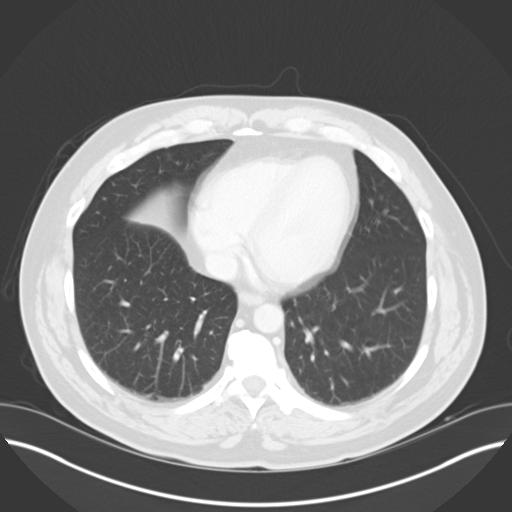

[13 of 32 positions shown; findings below may reference images not displayed]

FINDINGS: Lung bases are unremarkable.

Sagittal images of the spine shows no destructive bony lesions.
Mild degenerative changes lumbar spine.

Liver, spleen, pancreas and adrenal glands are unremarkable.  No
calcified gallstones are noted within gallbladder.

In axial image 42 there is a mesenteric lymph node just anterior to
right kidney the inferior to duodenum measures 2.2 x 1.3 cm.  This
is  pathologic by size criteria.  Metastatic disease cannot be
excluded.  Further evaluation possible PET scan is recommended.
Small mesenteric lymph nodes are noted.  There is a second
mesenteric lymph node in the right upper mesentery axial image 38
measures 10 x 9 mm borderline enlarged.

Again noted status post right hemicolectomy.  There is mild focal
thickening of the proximal colonic wall at the level of the
enterocolonic anastomosis.  There is mild stranding of the
surrounding fat.  This is best seen in the coronal image 35.
Findings are highly suspicious for mild focal colitis.  No definite
colonic mass is noted at this level.

Follow-up examination after appropriate treatment is recommended to
assure resolution.

There is no small bowel or colonic obstruction.  Mild
atherosclerotic calcifications of the distal abdominal aorta and
the iliac arteries.  No aortic aneurysm.

The left colon and sigmoid colon are unremarkable.

Urinary bladder is unremarkable.

Enhanced kidneys are symmetrical in size.  No hydronephrosis or
hydroureter.  Delayed renal images shows bilateral renal
symmetrical excretion.  Bilateral visualized proximal ureter is
unremarkable.

Prostate gland and seminal vesicles are unremarkable.  No
destructive bony lesions are noted within pelvis.
IMPRESSION: 1.  There is again noted status post right hemicolectomy.  Mild
focal thickening  of the proximal wall colonic wall in the right
upper abdomen at the site of the enteric colonic anastomosis.
There is mild stranding of the surrounding fat.  Findings are
suspicious for mild focal colitis.  No definite colonic mass is
noted at this level.
2.  There is a lymph node in the right lower quadrant mesentery
axial image 38 measures 10 x 9 mm.  This is borderline enlarged may
be reactive.  A second lymph node in the right upper mesentery
axial image 42 measures 2.2 x 1.3 cm.  This is pathologic by size
criteria.  Metastatic disease cannot be excluded. Further
evaluation possible PET scan is recommended.
3.  No small bowel or colonic obstruction.
4.  No destructive bony lesions are noted.

## 2014-03-01 ENCOUNTER — Other Ambulatory Visit: Payer: Self-pay | Admitting: Nurse Practitioner

## 2014-03-01 DIAGNOSIS — Z85038 Personal history of other malignant neoplasm of large intestine: Secondary | ICD-10-CM

## 2014-03-04 ENCOUNTER — Ambulatory Visit (HOSPITAL_BASED_OUTPATIENT_CLINIC_OR_DEPARTMENT_OTHER): Payer: Medicare Other | Admitting: Nurse Practitioner

## 2014-03-04 ENCOUNTER — Telehealth: Payer: Self-pay | Admitting: Oncology

## 2014-03-04 ENCOUNTER — Telehealth: Payer: Self-pay | Admitting: *Deleted

## 2014-03-04 ENCOUNTER — Other Ambulatory Visit: Payer: Self-pay

## 2014-03-04 VITALS — BP 153/92 | HR 75 | Temp 97.9°F | Resp 18 | Ht 67.0 in | Wt 216.3 lb

## 2014-03-04 DIAGNOSIS — Z85038 Personal history of other malignant neoplasm of large intestine: Secondary | ICD-10-CM

## 2014-03-04 LAB — CEA: CEA: 3.3 ng/mL (ref 0.0–5.0)

## 2014-03-04 NOTE — Telephone Encounter (Signed)
gv and printed appt sched and avs for pt for Dec °

## 2014-03-04 NOTE — Progress Notes (Signed)
[place] OFFICE PROGRESS NOTE   Diagnosis:  Colon cancer.  INTERVAL HISTORY:   Joe [name] returns as scheduled. He reports recently being diagnosed with diabetes. He will be attending a nutrition class in the near future. He notes increased tingling in the feet and hands as well as continued numbness. He continues Neurontin. No change in bowel habits. No bloody or black stools. He reports undergoing CT scans through his primary doctor about 3 weeks ago for evaluation of increased right-sided abdominal pain. He reports the scans did not show any evidence of cancer. He continues Percocet typically twice a day. No hematuria.  Objective:  Vital signs in last 24 hours:  Blood pressure 153/92, pulse 75, temperature 97.9 F (36.6 C), temperature source Oral, resp. rate 18, height _0  (1.702 m), weight 216 lb 4.8 oz (98.113 kg).    HEENT: No thrush or ulcerations. Lymphatics: No palpable cervical, supraclavicular, axillary or inguinal lymph nodes. Resp: Lungs clear. Cardio: Regular cardiac rhythm. GI: Abdomen is soft. Tenderness at the right mid abdomen surrounding the surgical incision. No mass. No organomegaly. Vascular: No leg edema.   Lab Results:  Lab Results  Component Value Date   WBC 6.3 03/21/2013   HGB 12.2* 03/21/2013   HCT 36.7* 03/21/2013   MCV 92.2 03/21/2013   PLT 144* 03/21/2013   NEUTROABS 4.5 03/20/2013    Imaging:  No results found.  Medications: I have reviewed the patient's current medications.  Assessment/Plan: 1. Stage III (T3 N1) colon cancer status post right hemicolectomy 08/20/2010. 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 He began treatment with CAPOX on 09/24/2010. He completed cycle 8 beginning 02/18/2011. Restaging CTs of the chest, abdomen, and pelvis on 08/24/2013 showed no evidence of metastatic disease. 2. Staging CT of the chest 09/17/2010 negative for evidence of metastatic disease. 3. Anemia likely related to  preoperative gastrointestinal bleeding: Resolved  4. History of alcohol use. 5. History of gout with an acute flare at the foot 11/26/2010. 6. Chronic obstructive pulmonary disease. 7. Hypertension. 8. Remote history of a bleeding gastric ulcer. 9. Family history of cancer. Microsatellite instability testing returned with a high microsatellite genotype. 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 Negative colonoscopy 03/23/2013 10. Right-sided abdominal pain surrounding the transverse incision, persistent. Question neuropathic pain related to surgery and oxaliplatin. 11. Acute neurotoxicity related to oxaliplatin manifested with leg weakness, ataxia, blurred vision following cycle 2 on 10/25/2010. The oxaliplatin was dose reduced by 50% with cycle 3 and diluted in a larger volume with administration over 4 hours. The acute neurotoxicity did not recur. 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 He has pain associated with the neuropathy. The neuropathy pain has improved with Neurontin. 13. Surveillance colonoscopy 08/17/2011 by Dr. Watt Climes with 2 small benign polyps at the left colon. He underwent a colonoscopy in May 2013 with inflammation at the surgical anastomosis with an appearance of "ischemia ". There was diverticulosis and a colonoscopy was otherwise normal. Negative colonoscopy 03/23/2013. 14. Right flank pain and dysuria 11/29/2011. Urinalysis was remarkable for too numerous to count red cells. Abdominal x-ray showed no renal calculi. He did not complain of flank pain or dysuria at today's visit. 15. Right breast mass and palpable right axillary lymph node. He is status post a an ultrasound-guided biopsy of the right breast on 01/03/2012 with the pathology confirming a fibroadenoma. Followup mammogram negative on 06/26/2012. 16. Right abdominal pain May 2013. Question colitis.  He continues to have intermittent right abdominal  pain. 17. Mesenteric lymph nodes noted on a CT 01/23/12-question reactive. No progressive lymphadenopathy on the CT 08/24/2013 18. Urine cytology 08/24/2012 with reactive urothelial cells present; acute inflammation. 19. Recent diagnosis of diabetes.   Disposition: Mr. Buys remains in clinical remission from colon cancer. We will followup on the CEA from today.   He has hereditary non-polyposis colon cancer syndrome. We will obtain a urine cytology when he returns in 6 months.  He continues to have neuropathy symptoms. He has recently noted an increase in symptoms. He will followup with his primary provider.  He will return for a followup visit here in 6 months. He will contact the office in the interim with any problems.  Plan reviewed with Dr. Benay Spice.    Joe [name] ANP/GNP-BC   03/04/2014  9:40 AM

## 2014-03-04 NOTE — Telephone Encounter (Signed)
Called to inform patient that the results from the urine cytology done on 09/03/13 can not be found and that Ned Card would like for him to come in, in the next couple of weeks to have another urine cytology done.  Per Elby Showers. Thomas.  Patient verbalized understanding and stated he could come in Monday 03/11/14.  Sent POF to scheduler.

## 2014-03-05 ENCOUNTER — Telehealth: Payer: Self-pay | Admitting: *Deleted

## 2014-03-05 NOTE — Telephone Encounter (Signed)
Called and informed patient of normal cea.  Per Dr. Sherrill.  Patient verbalized understanding.  

## 2014-03-05 NOTE — Telephone Encounter (Signed)
Message copied by Wardell Heath on Tue Mar 05, 2014 11:17 AM ------      Message from: Betsy Coder B      Created: Mon Mar 04, 2014  5:37 PM       Please call patient, cea is normal ------

## 2014-03-06 ENCOUNTER — Telehealth: Payer: Self-pay | Admitting: Oncology

## 2014-03-06 NOTE — Telephone Encounter (Signed)
Called pt and left message regarding appt for 03/08/14

## 2014-03-08 ENCOUNTER — Telehealth: Payer: Self-pay | Admitting: Oncology

## 2014-03-08 NOTE — Telephone Encounter (Signed)
Pt called and cancelled labs for 6/29, nurse notified

## 2014-03-11 ENCOUNTER — Other Ambulatory Visit: Payer: Self-pay

## 2014-08-12 ENCOUNTER — Telehealth: Payer: Self-pay | Admitting: Oncology

## 2014-08-12 NOTE — Telephone Encounter (Signed)
S/w pt advised appt chg from 12/14 to 12/22 @ 8.30. Pt verbalized understanding.

## 2014-08-26 ENCOUNTER — Ambulatory Visit: Payer: Self-pay | Admitting: Oncology

## 2014-08-26 ENCOUNTER — Other Ambulatory Visit: Payer: Self-pay

## 2014-09-03 ENCOUNTER — Other Ambulatory Visit (HOSPITAL_COMMUNITY)
Admission: RE | Admit: 2014-09-03 | Discharge: 2014-09-03 | Disposition: A | Discharge status: Home / Self Care | Payer: BC Managed Care – HMO | Source: Ambulatory Visit | Attending: Oncology | Admitting: Oncology

## 2014-09-03 ENCOUNTER — Telehealth: Payer: Self-pay | Admitting: Oncology

## 2014-09-03 ENCOUNTER — Ambulatory Visit: Payer: BC Managed Care – HMO

## 2014-09-03 ENCOUNTER — Ambulatory Visit (HOSPITAL_BASED_OUTPATIENT_CLINIC_OR_DEPARTMENT_OTHER): Payer: BC Managed Care – HMO | Admitting: Oncology

## 2014-09-03 ENCOUNTER — Ambulatory Visit: Payer: BC Managed Care – HMO | Admitting: Lab

## 2014-09-03 VITALS — BP 138/76 | HR 72 | Temp 98.0°F | Resp 18 | Ht 67.0 in | Wt 226.6 lb

## 2014-09-03 DIAGNOSIS — Z85038 Personal history of other malignant neoplasm of large intestine: Secondary | ICD-10-CM

## 2014-09-03 DIAGNOSIS — R319 Hematuria, unspecified: Secondary | ICD-10-CM | POA: Insufficient documentation

## 2014-09-03 LAB — CEA: CEA: 3.4 ng/mL (ref 0.0–5.0)

## 2014-09-03 NOTE — Telephone Encounter (Signed)
Gave avs & cal for June. °

## 2014-09-03 NOTE — Progress Notes (Signed)
[place] OFFICE PROGRESS NOTE   Diagnosis: Colon cancer  INTERVAL HISTORY:   Joe [name] returns as scheduled. He feels well. He is working. He was recently diagnosed with diabetes. He continues to have neuropathy symptoms in the hands and feet. The pain is improved with Neurontin. He is no longer smoking.  Objective:  Vital signs in last 24 hours:  Blood pressure 138/76, pulse 72, temperature 98 F (36.7 C), temperature source Oral, resp. rate 18, height 5' 7"  (1.702 m), weight 226 lb 9.6 oz (102.785 kg), SpO2 98 %.    HEENT: Neck without mass Lymphatics: No cervical, supra-clavicular, axillary, or inguinal nodes Resp: Lungs clear bilaterally Cardio: Regular rate and rhythm GI: No hepatomegaly, no mass, tender surrounding the right lower quadrant scar. Vascular: No leg edema   Lab Results:    Lab Results  Component Value Date   CEA 3.4 09/03/2014   Medications: I have reviewed the patient's current medications.  Assessment/Plan: 1. Stage III (T3 N1) colon cancer status post right hemicolectomy 08/20/2010. 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 He began treatment with CAPOX on 09/24/2010. He completed cycle 8 beginning 02/18/2011. Restaging CTs of the chest, abdomen, and pelvis on 08/24/2013 showed no evidence of metastatic disease. 2. Staging CT of the chest 09/17/2010 negative for evidence of metastatic disease. 3. Anemia likely related to preoperative gastrointestinal bleeding: Resolved  4. History of alcohol use. 5. History of gout with an acute flare at the foot 11/26/2010. 6. Chronic obstructive pulmonary disease. 7. Hypertension. 8. Remote history of a bleeding gastric ulcer. 9. Family history of cancer. Microsatellite instability testing returned with a high microsatellite genotype. 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  Negative colonoscopy  03/23/2013 10. Right-sided abdominal pain surrounding the transverse incision, persistent. Question neuropathic pain related to surgery and oxaliplatin. 11. Acute neurotoxicity related to oxaliplatin manifested with leg weakness, ataxia, blurred vision following cycle 2 on 10/25/2010. The oxaliplatin was dose reduced by 50% with cycle 3 and diluted in a larger volume with administration over 4 hours. The acute neurotoxicity did not recur. 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 He has pain associated with the neuropathy. The neuropathy pain has improved with Neurontin. 13. Surveillance colonoscopy 08/17/2011 by Dr. Watt Climes with 2 small benign polyps at the left colon. He underwent a colonoscopy in May 2013 with inflammation at the surgical anastomosis with an appearance of "ischemia ". There was diverticulosis and a colonoscopy was otherwise normal. Negative colonoscopy 03/23/2013. 14. Right flank pain and dysuria 11/29/2011. Urinalysis was remarkable for too numerous to count red cells. Abdominal x-ray showed no renal calculi.  15. Right breast mass and palpable right axillary lymph node. He is status post a an ultrasound-guided biopsy of the right breast on 01/03/2012 with the pathology confirming a fibroadenoma. Followup mammogram negative on 06/26/2012. 16. Right abdominal pain May 2013. Question colitis. He continues to have intermittent right abdominal pain. 17. Mesenteric lymph nodes noted on a CT 01/23/12-question reactive. No progressive lymphadenopathy on the CT 08/24/2013 18. Urine cytology 08/24/2012 with reactive urothelial cells present; acute inflammation. 19. Diabetes     Disposition:  Joe [name] remains in clinical remission from colon cancer. He will return for an office visit and CEA in 6 months. We will refer him to Dr. Oletta Lamas for a surveillance colonoscopy with the history of hereditary non-polyposis colon cancer syndrome. He submitted a urine for cytology today.  Betsy Coder,  MD  09/03/2014  4:38 PM

## 2014-09-04 ENCOUNTER — Telehealth: Payer: Self-pay | Admitting: *Deleted

## 2014-09-04 NOTE — Telephone Encounter (Signed)
-----   Message from Ladell Pier, MD sent at 09/03/2014  6:27 PM EST ----- Please call patient, cea is normal

## 2014-09-04 NOTE — Telephone Encounter (Signed)
-----   Message from Owens Shark, NP sent at 09/03/2014  4:33 PM EST ----- Please let him know CEA is normal.

## 2014-09-04 NOTE — Telephone Encounter (Signed)
Called pt with CEA results. Normal, per Dr. Benay Spice. Pt voiced understanding. Requests call back with urine results.

## 2014-09-04 NOTE — Telephone Encounter (Signed)
Left message for patient to call back regarding lab results.

## 2015-03-03 ENCOUNTER — Other Ambulatory Visit: Payer: BLUE CROSS/BLUE SHIELD

## 2015-03-03 ENCOUNTER — Telehealth: Payer: Self-pay | Admitting: Oncology

## 2015-03-03 ENCOUNTER — Ambulatory Visit (HOSPITAL_BASED_OUTPATIENT_CLINIC_OR_DEPARTMENT_OTHER): Payer: BLUE CROSS/BLUE SHIELD | Admitting: Nurse Practitioner

## 2015-03-03 VITALS — BP 147/80 | HR 102 | Temp 98.8°F | Resp 20 | Wt 207.4 lb

## 2015-03-03 DIAGNOSIS — Z85038 Personal history of other malignant neoplasm of large intestine: Secondary | ICD-10-CM

## 2015-03-03 LAB — CEA: CEA: 2.8 ng/mL (ref 0.0–5.0)

## 2015-03-03 NOTE — Progress Notes (Signed)
[place] OFFICE PROGRESS NOTE   Diagnosis:  Colon cancer  INTERVAL HISTORY:   Joe [name] returns as scheduled. He reports onset of intermittent sharp, stabbing, burning pain at the right lower quadrant scar several days ago. He is out of pain medication and has requested that we prescribe something. He reports he is undergoing evaluation for "groin pain". He reports he has been referred for physical therapy. No change in bowel habits. No bloody or black stools. He has occasional mild nausea. No vomiting. Neuropathy symptoms are stable.  Objective:  Vital signs in last 24 hours:  Blood pressure 147/80, pulse 102, temperature 98.8 F (37.1 C), temperature source Oral, resp. rate 20, weight 207 lb 6.4 oz (94.076 kg), SpO2 100 %.    HEENT: No thrush or ulcers. Lymphatics: No palpable cervical, supra clavicular, axillary or inguinal lymph nodes. Resp: Lungs clear bilaterally. Cardio: Regular rate and rhythm. GI: Abdomen soft and nontender. No hepatomegaly. No mass. Tender surrounding the right lower quadrant scar mainly at the lateral edge. Vascular: No leg edema.   Lab Results:  Lab Results  Component Value Date   WBC 6.3 03/21/2013   HGB 12.2* 03/21/2013   HCT 36.7* 03/21/2013   MCV 92.2 03/21/2013   PLT 144* 03/21/2013   NEUTROABS 4.5 03/20/2013    Imaging:  No results found.  Medications: I have reviewed the patient's current medications.  Assessment/Plan: 1. Stage III (T3 N1) colon cancer status post right hemicolectomy 08/20/2010. 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 He began treatment with CAPOX on 09/24/2010. He completed cycle 8 beginning 02/18/2011. Restaging CTs of the chest, abdomen, and pelvis on 08/24/2013 showed no evidence of metastatic disease. 2. Staging CT of the chest 09/17/2010 negative for evidence of metastatic disease. 3. Anemia likely related to preoperative gastrointestinal bleeding: Resolved  4. History of  alcohol use. 5. History of gout with an acute flare at the foot 11/26/2010. 6. Chronic obstructive pulmonary disease. 7. Hypertension. 8. Remote history of a bleeding gastric ulcer. 9. Family history of cancer. Microsatellite instability testing returned with a high microsatellite genotype. 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  Negative colonoscopy 03/23/2013  Colonoscopy 10/10/2014 with findings of a few deep linear ulcers in the small bowel and at the site of the anastomosis. Small bowel biopsies showed benign appearing ulcer with granulation tissue and acute and chronic inflammation. 10. Right-sided abdominal pain surrounding the transverse incision, persistent. Question neuropathic pain related to surgery and oxaliplatin. 11. Acute neurotoxicity related to oxaliplatin manifested with leg weakness, ataxia, blurred vision following cycle 2 on 10/25/2010. The oxaliplatin was dose reduced by 50% with cycle 3 and diluted in a larger volume with administration over 4 hours. The acute neurotoxicity did not recur. 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 He has pain associated with the neuropathy. The neuropathy pain has improved with Neurontin. 13. Surveillance colonoscopy 08/17/2011 by Dr. Watt Climes with 2 small benign polyps at the left colon. He underwent a colonoscopy in May 2013 with inflammation at the surgical anastomosis with an appearance of "ischemia ". There was diverticulosis and a colonoscopy was otherwise normal. Negative colonoscopy 03/23/2013. 14. Right flank pain and dysuria 11/29/2011. Urinalysis was remarkable for too numerous to count red cells. Abdominal x-ray showed no renal calculi.  15. Right breast mass and palpable right axillary lymph node. He is status post a an ultrasound-guided biopsy of the right breast on 01/03/2012 with the pathology  confirming a fibroadenoma. Followup mammogram negative on 06/26/2012. 16. Right  abdominal pain May 2013. Question colitis. He continues to have intermittent right abdominal pain. 17. Mesenteric lymph nodes noted on a CT 01/23/12-question reactive. No progressive lymphadenopathy on the CT 08/24/2013 18. Urine cytology 08/24/2012 with reactive urothelial cells present; acute inflammation. Urine cytology 09/03/2014 with reactive urothelial cells present. Acute inflammation. 19. Diabetes 20. Status post ileocolectomy 11/15/2014. Pathology showed anastomotic ulceration with marked reactive changes.   Disposition: Joe [name] remains in clinical remission from colon cancer. We will follow-up on the CEA from today. He will return for a follow-up visit and CEA in 6 months.  We recommended he follow-up with his PCP, surgeon or pain clinic regarding the right lower quadrant pain. We did not issue any prescriptions at today's visit.  Plan reviewed with Dr. Benay Spice.    Ned Card ANP/GNP-BC   03/03/2015  9:20 AM

## 2015-03-03 NOTE — Telephone Encounter (Signed)
Gave and printed appts ched and avs for pt for DEC  °

## 2015-07-30 ENCOUNTER — Telehealth: Payer: Self-pay | Admitting: Oncology

## 2015-07-30 NOTE — Telephone Encounter (Signed)
Due to PAL moved 12/19 lab/BS to 09/22/15 lab/LT. Left message for patient and mailed schedule.

## 2015-09-01 ENCOUNTER — Ambulatory Visit: Payer: BLUE CROSS/BLUE SHIELD | Admitting: Oncology

## 2015-09-01 ENCOUNTER — Other Ambulatory Visit: Payer: BLUE CROSS/BLUE SHIELD

## 2015-09-22 ENCOUNTER — Other Ambulatory Visit: Payer: BLUE CROSS/BLUE SHIELD

## 2015-09-22 ENCOUNTER — Ambulatory Visit: Payer: BLUE CROSS/BLUE SHIELD | Admitting: Nurse Practitioner

## 2019-07-26 ENCOUNTER — Encounter: Payer: Self-pay | Admitting: Genetic Counselor
